# Patient Record
Sex: Female | Born: 1985 | Race: White | Hispanic: No | Marital: Single | Smoking: Former smoker
Health system: Southern US, Community
[De-identification: ages and names within clinical notes are randomized; demographics above are authoritative.]

## PROBLEM LIST (undated history)

## (undated) DIAGNOSIS — I499 Cardiac arrhythmia, unspecified: Secondary | ICD-10-CM

## (undated) DIAGNOSIS — D3A020 Benign carcinoid tumor of the appendix: Secondary | ICD-10-CM

## (undated) DIAGNOSIS — R232 Flushing: Secondary | ICD-10-CM

## (undated) DIAGNOSIS — C801 Malignant (primary) neoplasm, unspecified: Secondary | ICD-10-CM

## (undated) DIAGNOSIS — O009 Unspecified ectopic pregnancy without intrauterine pregnancy: Secondary | ICD-10-CM

## (undated) DIAGNOSIS — R519 Headache, unspecified: Secondary | ICD-10-CM

## (undated) DIAGNOSIS — F419 Anxiety disorder, unspecified: Secondary | ICD-10-CM

## (undated) DIAGNOSIS — Z87442 Personal history of urinary calculi: Secondary | ICD-10-CM

## (undated) DIAGNOSIS — R51 Headache: Secondary | ICD-10-CM

## (undated) HISTORY — DX: Anxiety disorder, unspecified: F41.9

## (undated) HISTORY — PX: MELANOMA EXCISION: SHX5266

## (undated) HISTORY — DX: Flushing: R23.2

## (undated) HISTORY — PX: LAPAROSCOPY: SHX197

## (undated) HISTORY — DX: Cardiac arrhythmia, unspecified: I49.9

## (undated) HISTORY — DX: Benign carcinoid tumor of the appendix: D3A.020

## (undated) HISTORY — DX: Unspecified ectopic pregnancy without intrauterine pregnancy: O00.90

## (undated) HISTORY — PX: APPENDECTOMY: SHX54

---
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
## 2004-08-08 ENCOUNTER — Other Ambulatory Visit: Payer: Self-pay

## 2004-10-05 ENCOUNTER — Emergency Department: Payer: Self-pay | Admitting: Emergency Medicine

## 2004-12-27 ENCOUNTER — Emergency Department: Payer: Self-pay | Admitting: Emergency Medicine

## 2005-01-09 ENCOUNTER — Emergency Department: Payer: Self-pay | Admitting: Emergency Medicine

## 2005-03-25 ENCOUNTER — Emergency Department: Payer: Self-pay | Admitting: Emergency Medicine

## 2005-08-17 ENCOUNTER — Emergency Department: Payer: Self-pay | Admitting: Emergency Medicine

## 2005-09-22 ENCOUNTER — Emergency Department: Payer: Self-pay | Admitting: Emergency Medicine

## 2005-10-04 ENCOUNTER — Emergency Department: Payer: Self-pay | Admitting: General Practice

## 2006-07-28 ENCOUNTER — Emergency Department (HOSPITAL_COMMUNITY): Admission: EM | Admit: 2006-07-28 | Discharge: 2006-07-29 | Payer: Self-pay | Admitting: Emergency Medicine

## 2007-04-07 ENCOUNTER — Emergency Department: Payer: Self-pay | Admitting: Emergency Medicine

## 2007-08-05 ENCOUNTER — Emergency Department: Payer: Self-pay | Admitting: Emergency Medicine

## 2008-03-21 ENCOUNTER — Emergency Department: Payer: Self-pay | Admitting: Emergency Medicine

## 2008-05-10 ENCOUNTER — Emergency Department: Payer: Self-pay | Admitting: Emergency Medicine

## 2008-07-18 ENCOUNTER — Emergency Department: Payer: Self-pay | Admitting: Unknown Physician Specialty

## 2008-10-15 ENCOUNTER — Emergency Department: Payer: Self-pay | Admitting: Emergency Medicine

## 2009-05-19 ENCOUNTER — Emergency Department: Payer: Self-pay | Admitting: Emergency Medicine

## 2009-07-15 ENCOUNTER — Emergency Department: Payer: Self-pay | Admitting: Emergency Medicine

## 2009-10-28 ENCOUNTER — Emergency Department: Payer: Self-pay | Admitting: Emergency Medicine

## 2009-11-10 ENCOUNTER — Emergency Department: Payer: Self-pay | Admitting: Emergency Medicine

## 2010-06-03 ENCOUNTER — Emergency Department: Payer: Self-pay | Admitting: Internal Medicine

## 2010-06-28 ENCOUNTER — Emergency Department: Payer: Self-pay | Admitting: Emergency Medicine

## 2010-07-09 ENCOUNTER — Emergency Department: Payer: Self-pay | Admitting: Internal Medicine

## 2010-10-29 ENCOUNTER — Emergency Department: Payer: Self-pay | Admitting: Emergency Medicine

## 2010-11-06 DIAGNOSIS — D229 Melanocytic nevi, unspecified: Secondary | ICD-10-CM

## 2010-11-06 HISTORY — DX: Melanocytic nevi, unspecified: D22.9

## 2011-05-02 ENCOUNTER — Emergency Department: Payer: Self-pay | Admitting: Emergency Medicine

## 2012-08-11 ENCOUNTER — Ambulatory Visit: Payer: Self-pay | Admitting: Obstetrics and Gynecology

## 2012-08-11 DIAGNOSIS — O009 Unspecified ectopic pregnancy without intrauterine pregnancy: Secondary | ICD-10-CM

## 2012-08-11 HISTORY — PX: COMBINED HYSTEROSCOPY DIAGNOSTIC / D&C: SUR297

## 2012-08-11 HISTORY — DX: Unspecified ectopic pregnancy without intrauterine pregnancy: O00.90

## 2012-08-11 LAB — COMPREHENSIVE METABOLIC PANEL
Alkaline Phosphatase: 45 U/L — ABNORMAL LOW (ref 50–136)
Anion Gap: 6 — ABNORMAL LOW (ref 7–16)
Bilirubin,Total: 0.4 mg/dL (ref 0.2–1.0)
Chloride: 109 mmol/L — ABNORMAL HIGH (ref 98–107)
Co2: 24 mmol/L (ref 21–32)
Creatinine: 0.72 mg/dL (ref 0.60–1.30)
EGFR (African American): >60
EGFR (Non-African Amer.): >60
Osmolality: 276 (ref 275–301)
Potassium: 4.2 mmol/L (ref 3.5–5.1)
Sodium: 139 mmol/L (ref 136–145)

## 2012-08-11 LAB — CBC
HCT: 39 % (ref 35.0–47.0)
HGB: 13.2 g/dL (ref 12.0–16.0)
MCH: 31.1 pg (ref 26.0–34.0)
MCV: 92 fL (ref 80–100)
RBC: 4.24 10*6/uL (ref 3.80–5.20)
RDW: 12.8 % (ref 11.5–14.5)

## 2012-08-11 LAB — URINALYSIS, COMPLETE
Bacteria: NONE SEEN
Glucose,UR: NEGATIVE mg/dL (ref 0–75)
Ketone: NEGATIVE
Leukocyte Esterase: NEGATIVE
Nitrite: NEGATIVE
Ph: 7 (ref 4.5–8.0)
Protein: NEGATIVE
RBC,UR: 1 /HPF (ref 0–5)

## 2012-08-11 LAB — HCG, QUANTITATIVE, PREGNANCY: Beta Hcg, Quant.: 1207 m[IU]/mL — ABNORMAL HIGH

## 2012-08-17 LAB — PATHOLOGY REPORT

## 2012-08-21 HISTORY — PX: SALPINGECTOMY: SHX328

## 2012-09-06 ENCOUNTER — Observation Stay: Payer: Self-pay | Admitting: Obstetrics and Gynecology

## 2012-09-06 LAB — COMPREHENSIVE METABOLIC PANEL
Albumin: 4.4 g/dL (ref 3.4–5.0)
Anion Gap: 8 (ref 7–16)
Calcium, Total: 9 mg/dL (ref 8.5–10.1)
Chloride: 106 mmol/L (ref 98–107)
Co2: 26 mmol/L (ref 21–32)
Creatinine: 0.74 mg/dL (ref 0.60–1.30)
EGFR (African American): >60
EGFR (Non-African Amer.): >60
Osmolality: 277 (ref 275–301)
Potassium: 4.1 mmol/L (ref 3.5–5.1)
SGOT(AST): 22 U/L (ref 15–37)
SGPT (ALT): 15 U/L (ref 12–78)
Sodium: 140 mmol/L (ref 136–145)

## 2012-09-06 LAB — CBC
HCT: 35.3 % (ref 35.0–47.0)
HGB: 12 g/dL (ref 12.0–16.0)
MCV: 92 fL (ref 80–100)
Platelet: 223 10*3/uL (ref 150–440)
WBC: 8.8 10*3/uL (ref 3.6–11.0)

## 2012-09-06 LAB — LIPASE, BLOOD: Lipase: 83 U/L (ref 73–393)

## 2012-09-07 LAB — HEMATOCRIT: HCT: 32.2 % — ABNORMAL LOW (ref 35.0–47.0)

## 2012-09-12 LAB — PATHOLOGY REPORT

## 2012-09-23 ENCOUNTER — Ambulatory Visit: Payer: Self-pay | Admitting: Family Medicine

## 2012-10-11 ENCOUNTER — Ambulatory Visit: Payer: Self-pay | Admitting: Oncology

## 2012-10-21 ENCOUNTER — Ambulatory Visit: Payer: Self-pay | Admitting: Oncology

## 2013-02-20 IMAGING — CR DG CHEST 2V
1 series · 2 of 2 positions shown · non-contrast
Comparison: none

REASON FOR EXAM: cough
COMMENTS:

PROCEDURE:     KDR - KDXR CHEST PA (OR AP) AND LAT  - September 23, 2012  [DATE]
RESULT:     The lungs are well-expanded and clear. The mediastinum is normal
in width. The cardiac silhouette is normal in size. The pulmonary
vascularity is not engorged. There is no pleural effusion.

[Series 1: pa · 0.17mm/px · 2 of 2 slices shown]
[im 1/2]
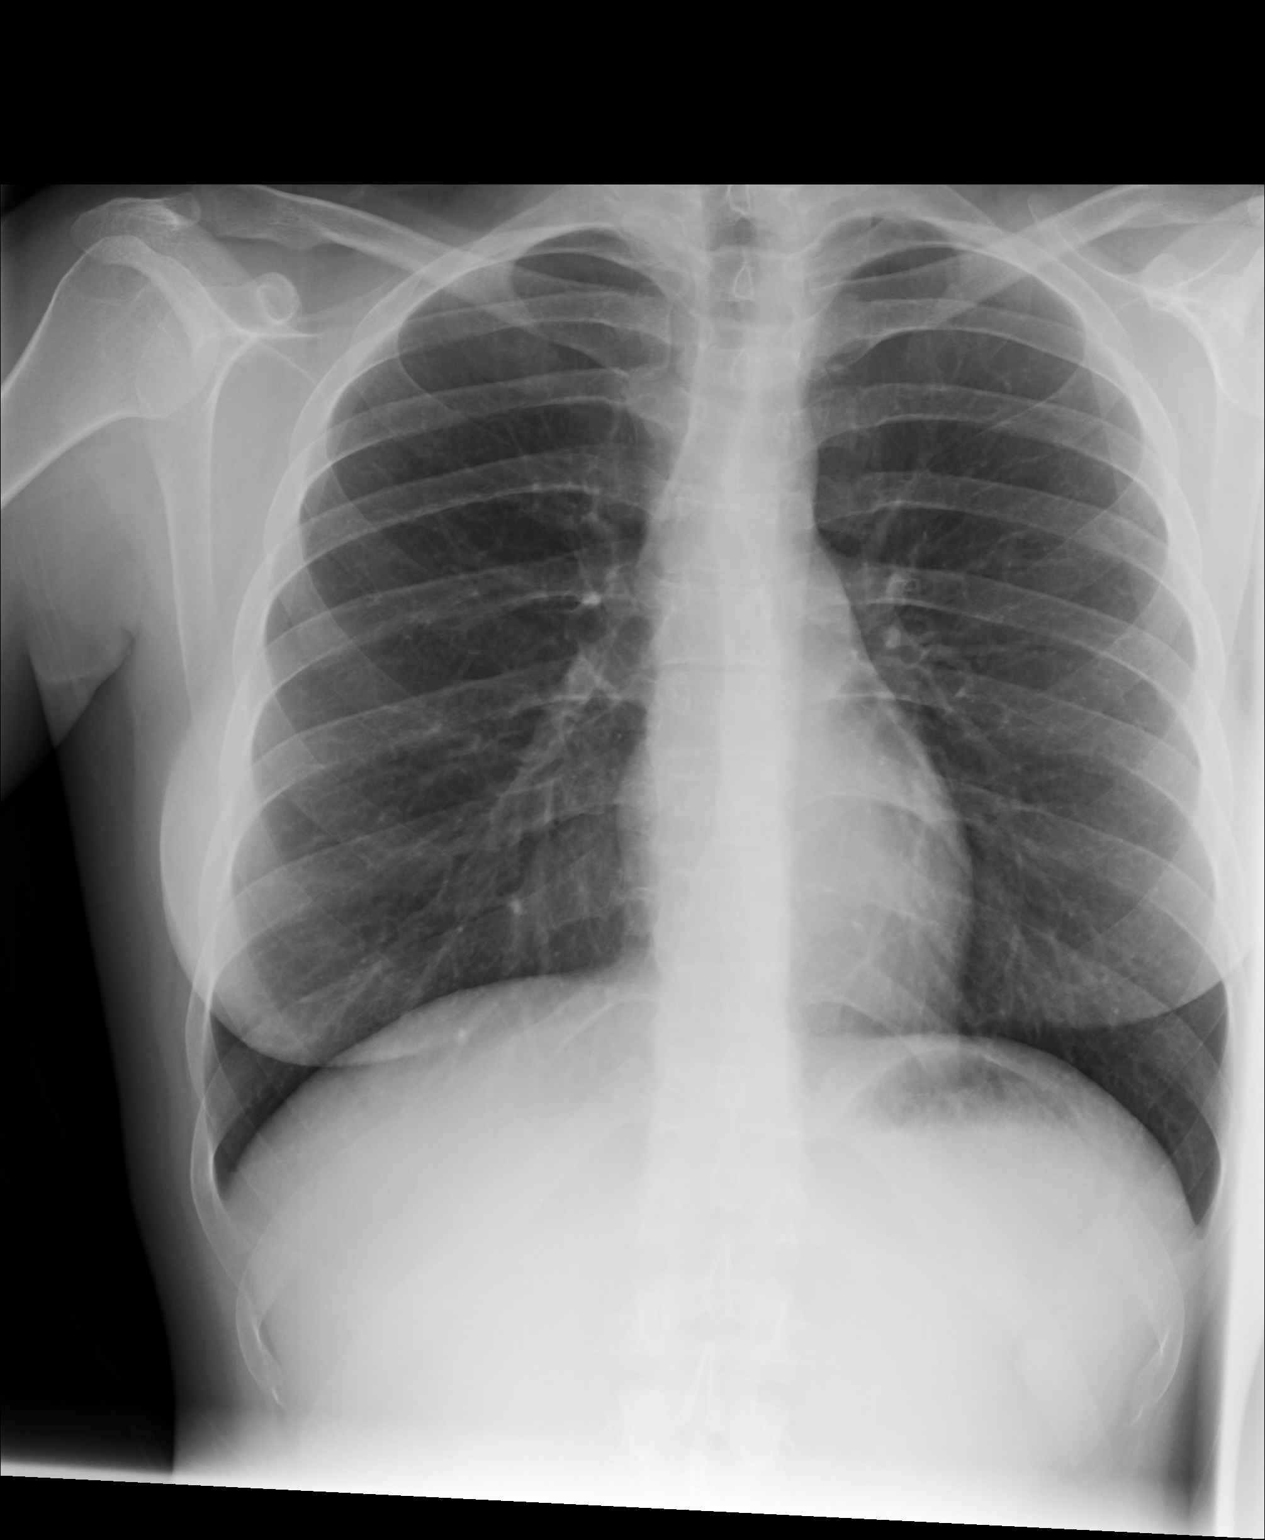
[im 2/2]
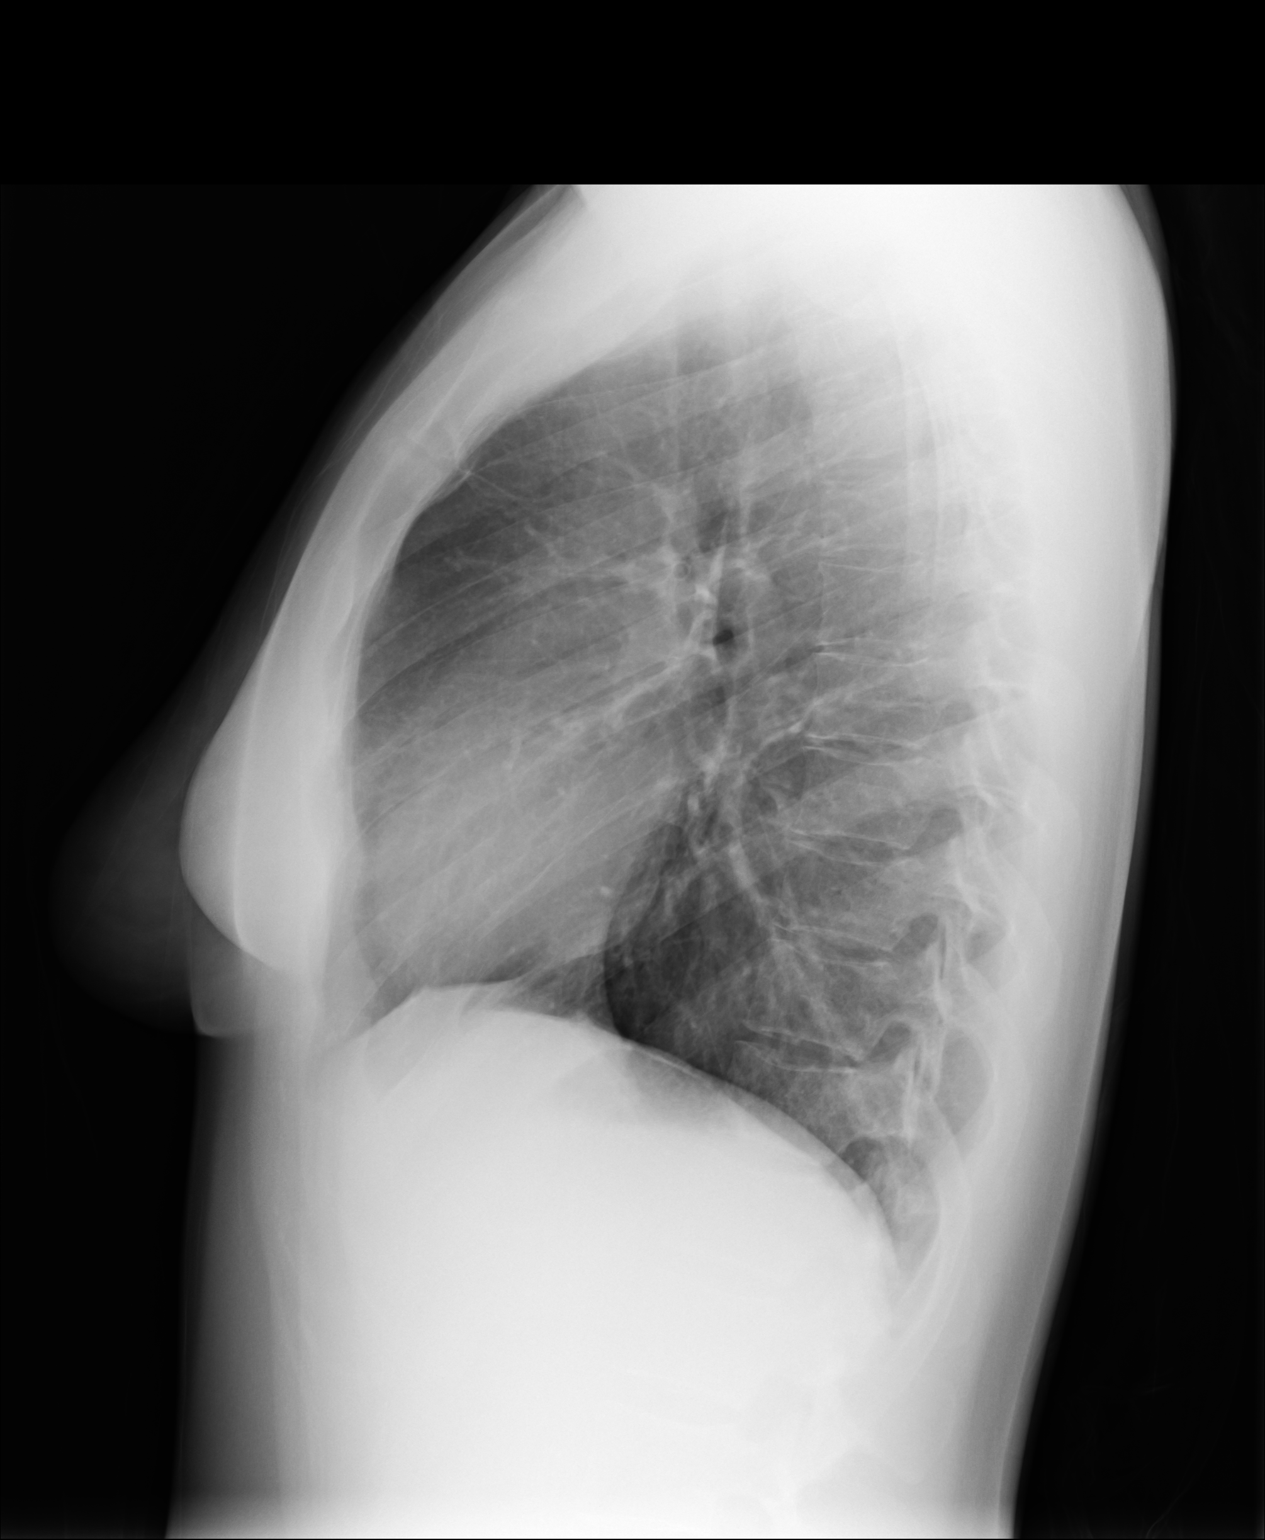

[2 of 2 positions shown; findings below may reference images not displayed]

IMPRESSION: There is no evidence of acute cardiopulmonary abnormality.

[REDACTED]

## 2013-03-21 ENCOUNTER — Ambulatory Visit: Payer: Self-pay | Admitting: Oncology

## 2013-04-03 ENCOUNTER — Ambulatory Visit: Payer: Self-pay | Admitting: Oncology

## 2013-04-03 ENCOUNTER — Emergency Department: Payer: Self-pay | Admitting: Emergency Medicine

## 2013-04-03 LAB — HCG, QUANTITATIVE, PREGNANCY: Beta Hcg, Quant.: <1 m[IU]/mL — ABNORMAL LOW

## 2013-04-12 LAB — HM PAP SMEAR: HM PAP: NEGATIVE

## 2013-04-15 ENCOUNTER — Emergency Department: Payer: Self-pay | Admitting: Emergency Medicine

## 2013-04-15 LAB — COMPREHENSIVE METABOLIC PANEL
Alkaline Phosphatase: 48 U/L — ABNORMAL LOW (ref 50–136)
Anion Gap: 9 (ref 7–16)
BUN: 13 mg/dL (ref 7–18)
Calcium, Total: 9.2 mg/dL (ref 8.5–10.1)
Chloride: 107 mmol/L (ref 98–107)
Co2: 22 mmol/L (ref 21–32)
Osmolality: 274 (ref 275–301)
Potassium: 3.8 mmol/L (ref 3.5–5.1)
SGPT (ALT): 16 U/L (ref 12–78)
Total Protein: 8.3 g/dL — ABNORMAL HIGH (ref 6.4–8.2)

## 2013-04-15 LAB — URINALYSIS, COMPLETE
Bilirubin,UR: NEGATIVE
Blood: NEGATIVE
Leukocyte Esterase: NEGATIVE
Nitrite: NEGATIVE
Ph: 6 (ref 4.5–8.0)
RBC,UR: <1 /HPF (ref 0–5)
Specific Gravity: 1.019 (ref 1.003–1.030)
WBC UR: 5 /HPF (ref 0–5)

## 2013-04-15 LAB — HCG, QUANTITATIVE, PREGNANCY: Beta Hcg, Quant.: 602 m[IU]/mL — ABNORMAL HIGH

## 2013-04-15 LAB — CBC
HGB: 14 g/dL (ref 12.0–16.0)
MCH: 30.3 pg (ref 26.0–34.0)
MCV: 91 fL (ref 80–100)
RBC: 4.62 10*6/uL (ref 3.80–5.20)

## 2013-04-15 LAB — WET PREP, GENITAL

## 2013-06-26 ENCOUNTER — Emergency Department: Payer: Self-pay | Admitting: Emergency Medicine

## 2013-06-26 LAB — CBC
HGB: 12 g/dL (ref 12.0–16.0)
MCH: 30.6 pg (ref 26.0–34.0)
MCHC: 34.1 g/dL (ref 32.0–36.0)
MCV: 90 fL (ref 80–100)
RBC: 3.94 10*6/uL (ref 3.80–5.20)

## 2013-06-26 LAB — BASIC METABOLIC PANEL
BUN: 9 mg/dL (ref 7–18)
Chloride: 104 mmol/L (ref 98–107)
Co2: 22 mmol/L (ref 21–32)
Creatinine: 0.58 mg/dL — ABNORMAL LOW (ref 0.60–1.30)
EGFR (Non-African Amer.): >60
Glucose: 72 mg/dL (ref 65–99)
Potassium: 3.8 mmol/L (ref 3.5–5.1)
Sodium: 136 mmol/L (ref 136–145)

## 2013-06-26 LAB — URINALYSIS, COMPLETE
Bilirubin,UR: NEGATIVE
Blood: NEGATIVE
Leukocyte Esterase: NEGATIVE
Protein: NEGATIVE
RBC,UR: 1 /HPF (ref 0–5)
Squamous Epithelial: 2

## 2013-06-26 LAB — HCG, QUANTITATIVE, PREGNANCY: Beta Hcg, Quant.: 42877 m[IU]/mL — ABNORMAL HIGH

## 2013-10-22 ENCOUNTER — Observation Stay: Payer: Self-pay | Admitting: Obstetrics and Gynecology

## 2013-10-22 LAB — URINALYSIS, COMPLETE
Bacteria: NONE SEEN
Blood: NEGATIVE
Leukocyte Esterase: NEGATIVE
Nitrite: NEGATIVE
Protein: NEGATIVE
Specific Gravity: 1.004 (ref 1.003–1.030)
Squamous Epithelial: 2
WBC UR: 1 /HPF (ref 0–5)

## 2013-10-22 LAB — FETAL FIBRONECTIN: Appearance: NORMAL

## 2013-12-13 ENCOUNTER — Observation Stay: Payer: Self-pay

## 2013-12-25 ENCOUNTER — Inpatient Hospital Stay: Payer: Self-pay | Admitting: Obstetrics and Gynecology

## 2013-12-26 LAB — CBC WITH DIFFERENTIAL/PLATELET
BASOS ABS: 0 10*3/uL (ref 0.0–0.1)
Basophil %: 0.4 %
EOS ABS: 0.1 10*3/uL (ref 0.0–0.7)
Eosinophil %: 1 %
HCT: 35 % (ref 35.0–47.0)
HGB: 11.8 g/dL — ABNORMAL LOW (ref 12.0–16.0)
Lymphocyte #: 1.3 10*3/uL (ref 1.0–3.6)
Lymphocyte %: 12.1 %
MCH: 28.9 pg (ref 26.0–34.0)
MCHC: 33.6 g/dL (ref 32.0–36.0)
MCV: 86 fL (ref 80–100)
Monocyte #: 0.8 x10 3/mm (ref 0.2–0.9)
Monocyte %: 7.1 %
NEUTROS ABS: 8.8 10*3/uL — AB (ref 1.4–6.5)
NEUTROS PCT: 79.4 %
PLATELETS: 163 10*3/uL (ref 150–440)
RBC: 4.08 10*6/uL (ref 3.80–5.20)
RDW: 12.7 % (ref 11.5–14.5)
WBC: 11.1 10*3/uL — ABNORMAL HIGH (ref 3.6–11.0)

## 2013-12-28 LAB — PATHOLOGY REPORT

## 2013-12-28 LAB — HEMATOCRIT: HCT: 29 % — AB (ref 35.0–47.0)

## 2014-05-15 ENCOUNTER — Emergency Department: Payer: Self-pay | Admitting: Emergency Medicine

## 2014-05-15 LAB — URINALYSIS, COMPLETE
BACTERIA: NONE SEEN
Bilirubin,UR: NEGATIVE
GLUCOSE, UR: NEGATIVE mg/dL (ref 0–75)
KETONE: NEGATIVE
LEUKOCYTE ESTERASE: NEGATIVE
Nitrite: NEGATIVE
Ph: 6 (ref 4.5–8.0)
Protein: NEGATIVE
RBC,UR: 3 /HPF (ref 0–5)
Specific Gravity: 1.023 (ref 1.003–1.030)

## 2014-05-27 ENCOUNTER — Emergency Department: Payer: Self-pay | Admitting: Emergency Medicine

## 2014-05-30 LAB — BETA STREP CULTURE(ARMC)

## 2014-06-13 DIAGNOSIS — F17200 Nicotine dependence, unspecified, uncomplicated: Secondary | ICD-10-CM | POA: Insufficient documentation

## 2014-09-19 DIAGNOSIS — E079 Disorder of thyroid, unspecified: Secondary | ICD-10-CM | POA: Insufficient documentation

## 2014-09-19 DIAGNOSIS — F419 Anxiety disorder, unspecified: Secondary | ICD-10-CM | POA: Insufficient documentation

## 2014-09-19 DIAGNOSIS — Z91041 Radiographic dye allergy status: Secondary | ICD-10-CM | POA: Insufficient documentation

## 2015-01-08 DIAGNOSIS — C439 Malignant melanoma of skin, unspecified: Secondary | ICD-10-CM

## 2015-01-08 HISTORY — DX: Malignant melanoma of skin, unspecified: C43.9

## 2015-04-09 NOTE — H&P (Signed)
    Subjective/Chief Complaint pt presents to the er with severe pain last night and now with ionability to get comfortable and some spotting    History of Present Illness one week ago a pt had one day of diarrhea and inability to slep, she had some severe ain last night and almost came to er, she came in this am becasue she was spotting and bleeding dark blood at work and she was uncomfrtsble. she took nothing. no position made it better, she has irregular periods and she says the pasin is dextedning  in toher back, she feel slike she needs to have a bm or padss gas abnd cannot. her last bm was 2-3 ays ago.. she has had some nausea, she has some peritoneal sugns by bracing herself on the car    Past History no bc for almost two yeras with no pregnancy, stopped ocp because she wanted to get pregnant with  her bf, was only on pills for regulation of her menses    Past Medical Health Smoking   Past Med/Surgical Hx:  Kidney Stones:   melanoma skin ca removed from her back:   ALLERGIES:  PCN: Hives, Swelling  Family and Social History:   Family History Non-Contributory    Social History positive  tobacco (Current within 1 year), positive ETOH    Place of Living Home   Review of Systems:   Subjective/Chief Complaint severe abdominal pain, discomfort    Fever/Chills No    Cough No    Sputum No    Abdominal Pain Yes    Diarrhea Yes    Constipation Yes    Nausea/Vomiting Yes    SOB/DOE No    Chest Pain No    Dysuria No    Tolerating Diet Nauseated   Physical Exam:   GEN well developed, well nourished, looks uncomfrtsblr    HEENT PERRL, hearing intact to voice, good dentition    NECK supple  No masses  trachea midline    RESP normal resp effort  clear BS    CARD regular rate  no murmur    ABD positive tenderness  distended  hypoactive BS    LYMPH negative neck, negative axillae    EXTR negative cyanosis/clubbing, negative edema    SKIN normal to palpation     NEURO cranial nerves intact    PSYCH alert, A+O to time, place, person    Additional Comments some pain on the rlq but not seveer, no rebound,     Assessment/Admission Diagnosis smptoms of ectopic asnd appendix, most likel ectopic    Plan take to or for dx ls salpingotomy, d adc   Electronic Signatures: Erik Obey (MD)  (Signed [phone] 18:49)  Authored: CHIEF COMPLAINT and HISTORY, PAST MEDICAL/SURGIAL HISTORY, ALLERGIES, FAMILY AND SOCIAL HISTORY, REVIEW OF SYSTEMS, PHYSICAL EXAM, ASSESSMENT AND PLAN   Last Updated: 22-Aug-13 18:49 by Erik Obey (MD)

## 2015-04-09 NOTE — Op Note (Signed)
PATIENT NAME:  Tracey Watson, Tracey Watson MR#:  [id number] DATE OF BIRTH:  1986/07/01  DATE OF PROCEDURE:  09/06/2012  PREOPERATIVE DIAGNOSIS: Ectopic pregnancy.   POSTOPERATIVE DIAGNOSIS: Ectopic pregnancy.  PROCEDURES: Diagnostic laparoscopy, evacuation of hemoperitoneum, right salpingectomy, and incidental appendectomy.   SURGEON: Phoebe Perch, MD  CO-SURGEON: Donneta Romberg, MD  ANESTHESIA: General with endotracheal tube.   INDICATIONS: This is a patient who Dr. Glennon Mac has taken to the Operating Room for recurrent ectopic pregnancy with a right tubal mass with abdominal pain. Because of the patient's complicated history with the need for a second operation, we discussed the rationale for offering general surgical intraoperative consultation with the possibility of incidental appendectomy. The risks of bleeding, infection, recurrent problems including bleeding and bowel injury, and open procedure were discussed with she and her significant other. Preoperatively, they understood and agreed to proceed.   Of note, Dr. Glennon Mac will dictate his portion of the operative report, and I will dictate the incidental appendectomy.   FINDINGS: Hemoperitoneum and right salpingeal mass suggestive of ectopic pregnancy, hemoperitoneum of approximately 200 to 300 mL, and normal appearing appendix.   DESCRIPTION OF PROCEDURE: The patient was induced to general anesthesia and given IV antibiotics. She was placed in a well-padded lithotomy position by Dr. Glennon Mac and the nursing staff and a Foley catheter was placed after exam under anesthesia by Dr. Glennon Mac as well. All of this is dictated separately.   Once the patient was prepped and draped in a sterile fashion, Marcaine was infiltrated in the skin and subcutaneous tissues around the infraumbilical area. Dr. Glennon Mac placed a 5 mm port and explored the abdomen finding hemoperitoneum and then under direct vision utilizing the patient's previous port sites another  suprapubic 5 mm port and a left lateral 12 mm port was placed avoiding the patient's tattoo. Hemoperitoneum was evacuated. Dr. Glennon Mac used the Harmonic scalpel to remove the right tube. Hemostasis was adequate.   My portion of the operation was the incidental appendectomy where the appendix was elevated. The base of the appendix was dissected free and the base of the appendix was divided with a standard load Endo GIA and then the Harmonic scalpel was utilized to divide the mesoappendix. No bleeding was noted. The area was irrigated with copious amounts of normal saline. Hemoperitoneum was evacuated. The patient was taken out of Trendelenburg position and blood that had been up around the liver was evacuated as well.   Under direct vision, the left lateral 12 mm port site was closed with two separate simple sutures of 0 Vicryl utilizing an Endo Close technique under direct vision after passing out both specimens through the left lateral port site with the aid of an Endo Catch bag.   The patient tolerated the procedure well. There were no complications. She was taken to the recovery room in stable condition to be admitted for continued care after closing her wounds with subcuticular 4-0 Monocryl and Dermabond. Again, a portion of this operation will be dictated by Dr. Glennon Mac. Sponge, lap, and needle counts were correct. ____________________________ Jerrol Banana. Burt Knack, MD rec:slb D: 09/06/2012 22:41:52 ET T: 09/07/2012 09:45:02 ET JOB#: [id number]  cc: Jerrol Banana. Burt Knack, MD, <Dictator> Florene Glen MD ELECTRONICALLY SIGNED 09/07/2012 19:10

## 2015-04-09 NOTE — Op Note (Signed)
PATIENT NAME:  Tracey Watson MR#:  [id number] DATE OF BIRTH:  [birth year]  DATE OF PROCEDURE:  09/06/2012  PREOPERATIVE DIAGNOSIS: Right lower quadrant pain, suspect ectopic pregnancy.   POSTOPERATIVE DIAGNOSIS: Ectopic pregnancy in right fallopian tube.   PROCEDURES:  1. Diagnostic laparoscopy.  2. Right salpingectomy.  3. Incidental appendectomy (performed by Dr. Phoebe Perch).   SURGEON: Prentice Docker, M.D.   ASSISTANT: Phoebe Perch, MD (primarily performed laparoscopic appendectomy).   ANESTHESIA: General.   ESTIMATED BLOOD LOSS: 200 mL (200 mL hemoperitoneum upon entry into the abdomen with minimal blood loss during surgery).   OPERATIVE FLUIDS: 500 mL crystalloid.  COMPLICATIONS: None.   FINDINGS:  1. Hemoperitoneum upon entry into the abdomen.  2. Right fallopian tube with area of erythema and swelling, highly suspicious for ectopic pregnancy.  3. Normal appearing left fallopian tube.  4. Normal appearing appendix.   SPECIMENS:  1. Right fallopian tube with ectopic pregnancy.  2. Appendix.   CONDITION AT THE END OF PROCEDURE: Stable.   INDICATIONS FOR PROCEDURE:   Tracey Watson is a 29 year old female who underwent surgery for ectopic pregnancy in the right fallopian tube in late August of this year. She had resolution of her symptoms except for the fact that she had intermittent vaginal bleeding in the early part of her postoperative course and would have intermittent vaginal bleeding associated with intercourse more recently. Approximately three days ago, she began having diffuse abdominal pain which worsened over the last couple of days. She presented to clinic today with severe abdominal pain. A pelvic ultrasound was performed that was highly suspicious for ectopic pregnancy with free fluid in the cul-de-sac. She was sent to the hospital for evaluation and likely surgery. She had an evaluation prior to her surgery given concern for diffuse abdominal pain with  especially right upper quadrant pain. With that concern, general surgery was consulted and was involved in the surgery. Prior to the surgery, she was consented for both gynecologic and general surgery procedures including right salpingectomy as well as appendectomy and any other indicated procedures given the nature of her pain. For the above, she was taken to the Operating Room.   DESCRIPTION OF PROCEDURE: The patient was taken to the Operating Room where general anesthesia was administered and found to be adequate. She was placed in the dorsal supine lithotomy position and prepped and draped in the usual sterile fashion. After a time-out was called, an indwelling Foley catheter was placed in her bladder. A sponge stick was placed in the vagina for later manipulation. Attention was turned to the abdomen where using her prior scars from her surgery one month ago, a 5 mm infraumbilical incision was made after injection of 0.25% Marcaine with epinephrine. Entry into the abdomen was obtained using the Optiview trocar using direct visualization. Upon entering the abdomen, abdominal entry was verified using opening pressure and once the abdomen was insufflated with CO2, the camera was reintroduced and atraumatic entry was verified. The abdomen was inspected with the above-noted findings. Hemopelvis and hemoperitoneum were noted with approximately 200 mL of blood already in the pelvis. A 5 mm right lower quadrant port and a 12 mm left lower quadrant port were placed under direct intra-abdominal camera visualization without difficulty. The pelvis was evacuated of blood and the abdomen was inspected with the above-noted findings.   Attention was turned to the right fallopian tube where the fimbriated end was grasped and elevated and using the Harmonic scalpel, the mesosalpinx was transected from a lateral  to medial direction and the right fallopian tube was removed. An EndoCatch bag was introduced through the left lower  quadrant port. The fallopian tube was placed in an EndoCatch bag and the EndoCatch bag, along with the specimen, were removed from the abdominal cavity at that point.   Attention was then turned to the cecum where at this point Dr. Burt Knack inspected the appendix and decided that removal was necessary and performed an appendectomy. For complete details of appendectomy portion of the surgery, please see Dr. Antionette Char operative report. After hemostasis was verified in all operative sites and in the abdomen, her abdomen was copiously irrigated and all irrigant was removed. The left lower quadrant port site was closed using fascial closure of 2-0 Vicryl fascial closure stitches and the abdomen was desufflated and the rest of the trocars were removed from the abdomen. A subcuticular stitch was used to reapproximate the left lower quadrant incision. The right lower quadrant and infraumbilical incisions had a single 4-0 Monocryl stitch placed for support and Dermabond was used to close all skin incision sites. Extra local anesthetic was injected at all incision sites at the end of the procedure. The Foley catheter was removed at the end of the procedure as well as the sponge stick and verification of the vagina being empty of all instrumentation was undertaken at the end of the procedure with no instruments or lap sponges remaining in the vagina at the end of the procedure.  The patient tolerated the procedure well. Lap, sponge and needle counts were correct x2. The patient received Mefoxin 2 grams prior to incision. She was awakened  the Operating Room and taken to the recovery area in stable condition.   ____________________________ Will Bonnet, MD sdj:ap D: 09/06/2012 23:20:49 ET T: 09/07/2012 10:11:24 ET JOB#: [id number]  cc: Will Bonnet, MD, <Dictator>  Will Bonnet MD ELECTRONICALLY SIGNED 10/18/2012 12:47

## 2016-10-15 DIAGNOSIS — C4359 Malignant melanoma of other part of trunk: Secondary | ICD-10-CM | POA: Insufficient documentation

## 2016-10-15 DIAGNOSIS — C181 Malignant neoplasm of appendix: Secondary | ICD-10-CM

## 2016-10-15 DIAGNOSIS — Z975 Presence of (intrauterine) contraceptive device: Secondary | ICD-10-CM | POA: Insufficient documentation

## 2016-10-15 HISTORY — DX: Malignant neoplasm of appendix: C18.1

## 2018-02-22 ENCOUNTER — Encounter: Payer: Self-pay | Admitting: Obstetrics and Gynecology

## 2018-02-23 ENCOUNTER — Ambulatory Visit (INDEPENDENT_AMBULATORY_CARE_PROVIDER_SITE_OTHER): Payer: 59 | Admitting: Obstetrics and Gynecology

## 2018-02-23 ENCOUNTER — Telehealth: Payer: Self-pay | Admitting: Obstetrics and Gynecology

## 2018-02-23 ENCOUNTER — Encounter: Payer: Self-pay | Admitting: Obstetrics and Gynecology

## 2018-02-23 DIAGNOSIS — Z308 Encounter for other contraceptive management: Secondary | ICD-10-CM | POA: Diagnosis not present

## 2018-02-23 DIAGNOSIS — Z01419 Encounter for gynecological examination (general) (routine) without abnormal findings: Secondary | ICD-10-CM

## 2018-02-23 MED ORDER — METRONIDAZOLE 500 MG PO TABS: 500.0000 mg | ORAL_TABLET | Freq: Two times a day (BID) | ORAL | 0 refills | Status: AC

## 2018-02-23 NOTE — Progress Notes (Signed)
Gynecology Annual Exam   PCP: Patient, No Pcp Per  Chief Complaint:  Chief Complaint  Patient presents with  . Gynecologic Exam    Bleeding on IUD (MIrena)  . Vaginal Discharge    Discharge w/odor  . IUD removal    History of Present Illness: Patient is a 32 y.o. G2P1011 presents for annual exam. The patient has no complaints today.   LMP: No LMP recorded. Patient is not currently having periods (Reason: IUD).  Experiencing very irregular but consistent break-through bleeding.  Also continues to have pain, this was initially attributed to her IUD being potentially in the lower uterine segment but imaging at [facility] 2017 revealed good positioning and deployment of the IUD.  She reports she does not desire future fertility and is actually most interested in tubal ligation  The patient is sexually active. She currently uses IUD for contraception. She does report dyspareunia.  The patient does perform self breast exams.  There is no notable family history of breast or ovarian cancer in her family.  The patient wears seatbelts: yes.   The patient has regular exercise: not asked.    The patient denies current symptoms of depression.    Tracey Watson is a 32 y.o. G2P1011 who LMP was No LMP recorded. Patient is not currently having periods (Reason: IUD)., presents today for a problem visit.   Patient complains of an abnormal vaginal discharge for off and on for about a week. Vaginal symptoms include discharge described as grey and odor.   Other associated symptoms: none.Menstrual pattern: She had been bleeding irregularly. Contraception: IUD.  She denies recent antibiotic exposure, denies changes in soaps, detergents coinciding with the onset of her symptoms.  She has previously self treated or been under treatment by another provider for these symptoms.  Tried some left over diflucan that she had with no improvement in symptoms  Review of Systems: Review of Systems  Constitutional:  Positive for malaise/fatigue. Negative for chills, diaphoresis, fever and weight loss.  HENT: Positive for sore throat. Negative for ear discharge, ear pain, hearing loss, nosebleeds and tinnitus.   Eyes: Negative.   Respiratory: Negative.   Cardiovascular: Negative.   Gastrointestinal: Negative.   Genitourinary: Negative.   Musculoskeletal: Negative.   Skin: Negative.   Neurological: Positive for tingling. Negative for dizziness, tremors, sensory change, speech change, weakness and headaches.  Endo/Heme/Allergies: Negative.   Psychiatric/Behavioral: Negative for depression. The patient is not nervous/anxious.     Past Medical History:  Past Medical History:  Diagnosis Date  . Benign carcinoid tumor of appendix   . Ectopic pregnancy 08/11/2012  . Hot flashes   . Irregular heart beat     Past Surgical History:  Past Surgical History:  Procedure Laterality Date  . APPENDECTOMY    . COLPOSCOPY  2003  . COMBINED HYSTEROSCOPY DIAGNOSTIC / D&C  08/11/2012  . LAPAROSCOPY  08/11/2012, 08/2012   Right salpingostomy Ectopic pregnancy by [place], 08/2012 Right Salpingectomy remaining ectopic  . MELANOMA EXCISION    . SALPINGECTOMY  08/2012    Gynecologic History:  No LMP recorded. Patient is not currently having periods (Reason: IUD). Contraception: IUD Last Pap: Results were:09/08/2016 no abnormalities  Position of IUD confirmed [place] [facility] 10/23/2016  Obstetric History: G2P1011  Family History:  History reviewed. No pertinent family history.  Social History:  Social History   Socioeconomic History  . Marital status: Single    Spouse name: Not on file  . Number of children: Not  on file  . Years of education: Not on file  . Highest education level: Not on file  Social Needs  . Financial resource strain: Not on file  . Food insecurity - worry: Not on file  . Food insecurity - inability: Not on file  . Transportation needs - medical: Not on file  . Transportation needs -  non-medical: Not on file  Occupational History  . Not on file  Tobacco Use  . Smoking status: Current Every Day Smoker    Types: Cigarettes  . Smokeless tobacco: Never Used  Substance and Sexual Activity  . Alcohol use: Yes    Comment: Socially  . Drug use: No  . Sexual activity: Yes    Partners: Male    Birth control/protection: IUD  Other Topics Concern  . Not on file  Social History Narrative  . Not on file    Allergies:  No Known Allergies  Medications: Prior to Admission medications   Medication Sig Start Date End Date Taking? Authorizing Provider  levonorgestrel (MIRENA) 20 MCG/24HR IUD by Intrauterine route.   Yes [provider]  loratadine (CLARITIN) 10 MG tablet Take 10 mg by mouth.   Yes [provider]    Physical Exam Vitals: Blood pressure 114/66, pulse 87, height 5\' 4"  (1.626 m), weight 152 lb (68.9 kg).  General: NAD HEENT: normocephalic, anicteric Thyroid: no enlargement, no palpable nodules Pulmonary: No increased work of breathing, CTAB Cardiovascular: RRR, distal pulses 2+ Breast: Breast symmetrical, no tenderness, no palpable nodules or masses, no skin or nipple retraction present, no nipple discharge.  No axillary or supraclavicular lymphadenopathy. Abdomen: NABS, soft, non-tender, non-distended.  Umbilicus without lesions.  No hepatomegaly, splenomegaly or masses palpable. No evidence of hernia  Genitourinary:  External: Normal external female genitalia.  Normal urethral meatus, normal  Bartholin's and Skene's glands.    Vagina: Normal vaginal mucosa, no evidence of prolapse.    Cervix: Grossly normal in appearance, no bleeding  Uterus: Non-enlarged, mobile, normal contour.  No CMT  Adnexa: ovaries non-enlarged, no adnexal masses  Rectal: deferred  Lymphatic: no evidence of inguinal lymphadenopathy Extremities: no edema, erythema, or tenderness Neurologic: Grossly intact Psychiatric: mood appropriate, affect full  Female  chaperone present for pelvic and breast  portions of the physical exam    Assessment: 32 y.o. G2P1011 routine annual exam  Plan: Problem List Items Addressed This Visit    None    Visit Diagnoses    Encounter for gynecological examination without abnormal finding       Encounter for tubal ligation counseling          2) STI screening  was notoffered and therefore not obtained  2)  ASCCP guidelines and rational discussed.  Patient opts for every 3 years screening interval  3) Contraception - the patient is currently using  IUD.  She is interested in changing to tubal ligation  4) Routine healthcare maintenance including cholesterol, diabetes screening discussed managed by PCP  5) Post laproscopic salpingectomy and IUD removal.  Other reversible forms of contraception were discussed with patient; she declines all other modalities. Permanent nature of as well as associated risks of the procedure discussed with patient including but not limited to: risk of regret, permanence of method, bleeding, infection, injury to surrounding organs and need for additional procedures.  Failure risk of 0.5-1% with increased risk of ectopic gestation if pregnancy occurs was also discussed with patient.    6) Risk factors for bacterial vaginosis and candida infections discussed.  We discussed normal vaginal flora/microbiome.  Any factors that may alter the microbiome increase the risk of these opportunistic infections.  These include changes in pH, antibiotic exposures, diabetes, wet bathing suits etc.  We discussed that treatment is aimed at eradicating abnormal bacterial overgrowth and or yeast.  There may be some role for vaginal probiotics in restoring normal vaginal flora.    7)  Return in about 1 year (around 02/24/2019) for aNNUAL.   Malachy Mood, MD, [facility], [facility] 02/23/2018, 9:41 AM

## 2018-02-23 NOTE — Telephone Encounter (Signed)
Patient is aware of H&P at [facility] on 03/18/18 @ 9:10am, Pre-admit Testing phone interview to be scheduled, and OR on 03/24/18.

## 2018-02-23 NOTE — Telephone Encounter (Signed)
-----   Message from Malachy Mood, MD sent at 02/23/2018  9:41 AM EST ----- Regarding: Surgery Surgery Date:   LOS: same day surgery  Surgery Booking Request Patient Full Name: Tracey Watson MRN: [id number]  DOB: 1986-08-11  Surgeon: Malachy Mood, MD  Requested Surgery Date and Time: Undesired fertility Primary Diagnosis and Code:  Secondary Diagnosis and Code:  Surgical Procedure: Laparoscopic left salpingectomy, and Mirena IUD removal L&D Notification:no Admission Status: same day surgery Length of Surgery: 1h Special Case Needs: Falope rings H&P:  (date) Phone Interview or Office Pre-Admit: phone interview Interpreter: No Language: English Medical Clearance: No Special Scheduling Instructions: none

## 2018-03-18 ENCOUNTER — Encounter
Admission: RE | Admit: 2018-03-18 | Discharge: 2018-03-18 | Disposition: A | Discharge status: Home / Self Care | Payer: 59 | Source: Ambulatory Visit | Attending: Obstetrics and Gynecology | Admitting: Obstetrics and Gynecology

## 2018-03-18 ENCOUNTER — Other Ambulatory Visit: Payer: Self-pay

## 2018-03-18 ENCOUNTER — Encounter: Payer: Self-pay | Admitting: Obstetrics and Gynecology

## 2018-03-18 ENCOUNTER — Ambulatory Visit (INDEPENDENT_AMBULATORY_CARE_PROVIDER_SITE_OTHER): Payer: 59 | Admitting: Obstetrics and Gynecology

## 2018-03-18 VITALS — BP 100/66 | HR 65 | Ht 64.0 in | Wt 153.0 lb

## 2018-03-18 DIAGNOSIS — Z01818 Encounter for other preprocedural examination: Secondary | ICD-10-CM

## 2018-03-18 HISTORY — DX: Headache, unspecified: R51.9

## 2018-03-18 HISTORY — DX: Personal history of urinary calculi: Z87.442

## 2018-03-18 HISTORY — DX: Malignant (primary) neoplasm, unspecified: C80.1

## 2018-03-18 HISTORY — DX: Headache: R51

## 2018-03-18 NOTE — Progress Notes (Signed)
Obstetrics & Gynecology Surgery H&P    Chief Complaint: Scheduled Surgery   History of Present Illness: Patient is a 32 y.o. G2P1011 presenting for scheduled Mirena IUD removal, hysteroscopy, D&C, laparoscopic bilateral salpingectomy, and pelvic washings, for the treatment or further evaluation of retained Mirena IUD, menorrhagia, and undesired fertility.   Prior Treatments prior to proceeding with surgery include: [place] confirming IUD placement  Preoperative Pap:09/08/2016  Results: no abnormalities  Preoperative Ultrasound: 10/23/2016 IUD confirmed in uterine cavity   Review of Systems:10 point review of systems  Past Medical History:  Past Medical History:  Diagnosis Date  . Benign carcinoid tumor of appendix   . Ectopic pregnancy 08/11/2012  . Hot flashes   . Irregular heart beat     Past Surgical History:  Past Surgical History:  Procedure Laterality Date  . APPENDECTOMY    . COLPOSCOPY  2003  . COMBINED HYSTEROSCOPY DIAGNOSTIC / D&C  08/11/2012  . LAPAROSCOPY  08/11/2012, 08/2012   Right salpingostomy Ectopic pregnancy by Klett, 08/2012 Right Salpingectomy remaining ectopic  . MELANOMA EXCISION    . SALPINGECTOMY  08/2012    Family History:  No family history on file.  Social History:  Social History   Socioeconomic History  . Marital status: Single    Spouse name: Not on file  . Number of children: Not on file  . Years of education: Not on file  . Highest education level: Not on file  Occupational History  . Not on file  Social Needs  . Financial resource strain: Not on file  . Food insecurity:    Worry: Not on file    Inability: Not on file  . Transportation needs:    Medical: Not on file    Non-medical: Not on file  Tobacco Use  . Smoking status: Current Every Day Smoker    Types: Cigarettes  . Smokeless tobacco: Never Used  Substance and Sexual Activity  . Alcohol use: Yes    Comment: Socially  . Drug use: No  . Sexual activity: Yes   Partners: Male    Birth control/protection: IUD  Lifestyle  . Physical activity:    Days per week: 0 days    Minutes per session: 0 min  . Stress: To some extent  Relationships  . Social connections:    Talks on phone: More than three times a week    Gets together: Once a week    Attends religious service: 1 to 4 times per year    Active member of club or organization: No    Attends meetings of clubs or organizations: Never    Relationship status: Living with partner  . Intimate partner violence:    Fear of current or ex partner: No    Emotionally abused: No    Physically abused: No    Forced sexual activity: No  Other Topics Concern  . Not on file  Social History Narrative  . Not on file    Allergies:  No Known Allergies  Medications: Prior to Admission medications   Medication Sig Start Date End Date Taking? Authorizing Provider  acetaminophen (TYLENOL) 500 MG tablet Take 1,000 mg by mouth every 6 (six) hours as needed for moderate pain or headache.   Yes [provider]  levonorgestrel (MIRENA) 20 MCG/24HR IUD 1 each by Intrauterine route once.    Yes [provider]  loratadine (CLARITIN) 10 MG tablet Take 10 mg by mouth daily as needed for allergies.    Yes [provider]    Physical Exam Vitals: Blood pressure 100/66, pulse 65, height 5\' 4"  (1.626 m), weight 153 lb (69.4 kg). General: NAD HEENT: normocephalic, anicteric Pulmonary: CTAB, No increased work of breathing Cardiovascular: RRR, distal pulses 2+ Abdomen: soft, non-tender, non-distended Genitourinary: deferred Extremities: no edema, erythema, or tenderness Neurologic: Grossly intact Psychiatric: mood appropriate, affect full  Imaging No results found.  Assessment: 31 y.o. G2P1011 presenting for scheduled  Mirena IUD removal, hysteroscopy, D&C, laparoscopic bilateral salpingectomy, and pelvic washings  Plan: 1) I have had a careful discussion with this patient about all  the options available and the risk/benefits of each. I have fully informed this patient that a laparoscopy may subject her to a variety of discomforts and risks: She understands that most patients have surgery with little difficulty, but problems can happen ranging from minor to fatal. These include nausea, vomiting, pain, bleeding, infection, poor healing, hernia, or formation of adhesions. Unexpected reactions may occur from any drug or anesthetic given. Unintended injury may occur to other pelvic or abdominal structures such as Fallopian tubes, ovaries, bladder, ureter (tube from kidney to bladder), or bowel. Nerves going from the pelvis to the legs may be injured. Any such injury may require immediate or later additional surgery to correct the problem. Excessive blood loss requiring transfusion is very unlikely but possible. Dangerous blood clots may form in the legs or lungs. Physical and sexual activity will be restricted in varying degrees for an indeterminate period of time but most often 2-4 weeks. She understands that the plan is to do this laparoscopically, however, there is a chance that this will need to be performed via a larger incision. Finally, she understands that it is impossible to list every possible undesirable effect and that the condition for which surgery is done is not always cured or significantly improved, and in rare cases may be even worsen. Ample time was given to answer all questions.  32 y.o. [id number]  with undesired fertility, desires permanent sterilization.  Other reversible forms of contraception were discussed with patient; she declines all other modalities. Permanent nature of as well as associated risks of the procedure discussed with patient including but not limited to: risk of regret, permanence of method, bleeding, infection, injury to surrounding organs and need for additional procedures.  Failure risk of 0.5-1% with increased risk of ectopic gestation if pregnancy  occurs was also discussed with patient.    2) Routine postoperative instructions were reviewed with the patient and her family in detail today including the expected length of recovery and likely postoperative course.  The patient concurred with the proposed plan, giving informed written consent for the surgery today.  Patient instructed on the importance of being NPO after midnight prior to her procedure.  If warranted preoperative prophylactic antibiotics and SCDs ordered on call to the OR to meet SCIP guidelines and adhere to recommendation laid forth in [place] Number 104 May 2009  "Antibiotic Prophylaxis for Gynecologic Procedures".     Malachy Mood, MD, [facility], [facility] 03/18/2018, 9:43 AM

## 2018-04-06 NOTE — Patient Instructions (Addendum)
Your procedure is scheduled on: 04-07-18 Report to Same Day Surgery 2nd floor medical mall [facility] Entrance-take elevator on left to 2nd floor.  Check in with surgery information desk.) To find out your arrival time please call [phone] between 1PM - 3PM on 04-06-18  Remember: Instructions that are not followed completely may result in serious medical risk, up to and including death, or upon the discretion of your surgeon and anesthesiologist your surgery may need to be rescheduled.    _x___ 1. Do not eat food after midnight the night before your procedure. NO GUM OR CANDY AFTER MIDNIGHT.  You may drink clear liquids up to 2 hours before you are scheduled to arrive at the hospital for your procedure.  Do not drink clear liquids within 2 hours of your scheduled arrival to the hospital.  Clear liquids include  --Water or Apple juice without pulp  --Clear carbohydrate beverage such as ClearFast or Gatorade  --Black Coffee or Clear Tea (No milk, no creamers, do not add anything to the coffee or Tea     __x__ 2. No Alcohol for 24 hours before or after surgery.   __x__3. No Smoking or e-cigarettes for 24 prior to surgery.  Do not use any chewable tobacco products for at least 6 hour prior to surgery   ____  4. Bring all medications with you on the day of surgery if instructed.    __x__ 5. Notify your doctor if there is any change in your medical condition     (cold, fever, infections).    x___6. On the morning of surgery brush your teeth with toothpaste and water.  You may rinse your mouth with mouth wash if you wish.  Do not swallow any toothpaste or mouthwash.   Do not wear jewelry, make-up, hairpins, clips or nail polish.  Do not wear lotions, powders, or perfumes. You may wear deodorant.  Do not shave 48 hours prior to surgery. Men may shave face and neck.  Do not bring valuables to the hospital.    [facility] is not responsible for any belongings or valuables.    Contacts, dentures or bridgework may not be worn into surgery.  Leave your suitcase in the car. After surgery it may be brought to your room.  For patients admitted to the hospital, discharge time is determined by your  treatment team.  _  Patients discharged the day of surgery will not be allowed to drive home.  You will need someone to drive you home and stay with you the night of your procedure.     ____ Take anti-hypertensive listed below, cardiac, seizure, asthma, anti-reflux and psychiatric medicines. These include:  1. NONE  2.  3.  4.  5.  6.  ____Fleets enema or Magnesium Citrate as directed.   ____ Use CHG Soap or sage wipes as directed on instruction sheet   ____ Use inhalers on the day of surgery and bring to hospital day of surgery  ____ Stop Metformin and Janumet 2 days prior to surgery.    ____ Take 1/2 of usual insulin dose the night before surgery and none on the morning surgery.   ____ Follow recommendations from Cardiologist, Pulmonologist or PCP regarding stopping Aspirin, Coumadin, Plavix ,Eliquis, Effient, or Pradaxa, and Pletal.  X____Stop Anti-inflammatories such as Advil, Aleve, Ibuprofen, Motrin, Naproxen, Naprosyn, Goodies powders or aspirin products 7 DAYS PRIOR TO SURGERY-OK to take Tylenol   ____ Stop supplements until after surgery. .   ____ Bring C-Pap  to the hospital.

## 2018-04-07 ENCOUNTER — Ambulatory Visit
Admission: RE | Admit: 2018-04-07 | Discharge: 2018-04-07 | Disposition: A | Discharge status: Home / Self Care | Payer: 59 | Source: Ambulatory Visit | Attending: Obstetrics and Gynecology | Admitting: Obstetrics and Gynecology

## 2018-04-07 ENCOUNTER — Ambulatory Visit: Payer: 59 | Admitting: Anesthesiology

## 2018-04-07 ENCOUNTER — Encounter
Admission: RE | Disposition: A | Discharge status: Home / Self Care | Payer: Self-pay | Source: Ambulatory Visit | Attending: Obstetrics and Gynecology

## 2018-04-07 ENCOUNTER — Other Ambulatory Visit: Payer: Self-pay

## 2018-04-07 ENCOUNTER — Encounter: Payer: Self-pay | Admitting: *Deleted

## 2018-04-07 DIAGNOSIS — Z9079 Acquired absence of other genital organ(s): Secondary | ICD-10-CM | POA: Insufficient documentation

## 2018-04-07 DIAGNOSIS — Z4589 Encounter for adjustment and management of other implanted devices: Secondary | ICD-10-CM

## 2018-04-07 DIAGNOSIS — N920 Excessive and frequent menstruation with regular cycle: Secondary | ICD-10-CM | POA: Insufficient documentation

## 2018-04-07 DIAGNOSIS — N719 Inflammatory disease of uterus, unspecified: Secondary | ICD-10-CM | POA: Insufficient documentation

## 2018-04-07 DIAGNOSIS — Z302 Encounter for sterilization: Secondary | ICD-10-CM | POA: Diagnosis not present

## 2018-04-07 DIAGNOSIS — Z85038 Personal history of other malignant neoplasm of large intestine: Secondary | ICD-10-CM | POA: Insufficient documentation

## 2018-04-07 DIAGNOSIS — Z9889 Other specified postprocedural states: Secondary | ICD-10-CM

## 2018-04-07 DIAGNOSIS — F17200 Nicotine dependence, unspecified, uncomplicated: Secondary | ICD-10-CM | POA: Diagnosis not present

## 2018-04-07 HISTORY — PX: HYSTEROSCOPY WITH D & C: SHX1775

## 2018-04-07 HISTORY — PX: LAPAROSCOPIC UNILATERAL SALPINGECTOMY: SHX5934

## 2018-04-07 HISTORY — PX: IUD REMOVAL: SHX5392

## 2018-04-07 LAB — POCT PREGNANCY, URINE: Preg Test, Ur: NEGATIVE

## 2018-04-07 SURGERY — SALPINGECTOMY, UNILATERAL, LAPAROSCOPIC
Anesthesia: General

## 2018-04-07 MED ORDER — HYDROMORPHONE HCL 1 MG/ML IJ SOLN
INTRAMUSCULAR | Status: AC
  Filled 2018-04-07: qty 1

## 2018-04-07 MED ORDER — HYDROCODONE-ACETAMINOPHEN 5-325 MG PO TABS: 1.0000 | ORAL_TABLET | ORAL | 0 refills | Status: DC | PRN

## 2018-04-07 MED ORDER — ACETAMINOPHEN NICU IV SYRINGE 10 MG/ML
INTRAVENOUS | Status: AC
  Filled 2018-04-07: qty 1

## 2018-04-07 MED ORDER — GLYCOPYRROLATE 0.2 MG/ML IJ SOLN
INTRAMUSCULAR | Status: DC | PRN
  Administered 2018-04-07: 0.1 mg via INTRAVENOUS

## 2018-04-07 MED ORDER — HYDROMORPHONE HCL 1 MG/ML IJ SOLN
INTRAMUSCULAR | Status: DC | PRN
  Administered 2018-04-07 (×2): 1 mg via INTRAVENOUS

## 2018-04-07 MED ORDER — PROPOFOL 10 MG/ML IV BOLUS
INTRAVENOUS | Status: DC | PRN
  Administered 2018-04-07: 150 mg via INTRAVENOUS
  Administered 2018-04-07: 50 mg via INTRAVENOUS

## 2018-04-07 MED ORDER — OXYCODONE HCL 5 MG/5ML PO SOLN: 5.0000 mg | Freq: Once | ORAL | Status: AC | PRN

## 2018-04-07 MED ORDER — LACTATED RINGERS IV SOLN
INTRAVENOUS | Status: DC
  Administered 2018-04-07: 10:00:00 via INTRAVENOUS

## 2018-04-07 MED ORDER — FAMOTIDINE 20 MG PO TABS
20.0000 mg | ORAL_TABLET | Freq: Once | ORAL | Status: AC
  Administered 2018-04-07: 20 mg via ORAL

## 2018-04-07 MED ORDER — KETOROLAC TROMETHAMINE 30 MG/ML IJ SOLN
INTRAMUSCULAR | Status: DC | PRN
  Administered 2018-04-07: 30 mg via INTRAVENOUS

## 2018-04-07 MED ORDER — ROCURONIUM BROMIDE 50 MG/5ML IV SOLN
INTRAVENOUS | Status: AC
  Filled 2018-04-07: qty 1

## 2018-04-07 MED ORDER — MIDAZOLAM HCL 2 MG/2ML IJ SOLN
INTRAMUSCULAR | Status: DC | PRN
  Administered 2018-04-07: 4 mg via INTRAVENOUS

## 2018-04-07 MED ORDER — FAMOTIDINE 20 MG PO TABS
ORAL_TABLET | ORAL | Status: AC
  Administered 2018-04-07: 20 mg via ORAL
  Filled 2018-04-07: qty 1

## 2018-04-07 MED ORDER — BUPIVACAINE HCL (PF) 0.5 % IJ SOLN
INTRAMUSCULAR | Status: AC
  Filled 2018-04-07: qty 30

## 2018-04-07 MED ORDER — GLYCOPYRROLATE 0.2 MG/ML IJ SOLN
INTRAMUSCULAR | Status: AC
  Filled 2018-04-07: qty 1

## 2018-04-07 MED ORDER — FENTANYL CITRATE (PF) 100 MCG/2ML IJ SOLN
INTRAMUSCULAR | Status: AC
  Administered 2018-04-07: 25 ug via INTRAVENOUS
  Filled 2018-04-07: qty 2

## 2018-04-07 MED ORDER — IBUPROFEN 600 MG PO TABS: 600.0000 mg | ORAL_TABLET | Freq: Four times a day (QID) | ORAL | 3 refills | Status: DC | PRN

## 2018-04-07 MED ORDER — SEVOFLURANE IN SOLN
RESPIRATORY_TRACT | Status: AC
Start: 2018-04-07 — End: 2018-04-07
  Filled 2018-04-07: qty 250

## 2018-04-07 MED ORDER — PROPOFOL 10 MG/ML IV BOLUS
INTRAVENOUS | Status: AC
  Filled 2018-04-07: qty 20

## 2018-04-07 MED ORDER — ACETAMINOPHEN 10 MG/ML IV SOLN
INTRAVENOUS | Status: DC | PRN
Start: 2018-04-07 — End: 2018-04-07
  Administered 2018-04-07: 1000 mg via INTRAVENOUS

## 2018-04-07 MED ORDER — MIDAZOLAM HCL 2 MG/2ML IJ SOLN
INTRAMUSCULAR | Status: AC
  Filled 2018-04-07: qty 4

## 2018-04-07 MED ORDER — OXYCODONE HCL 5 MG PO TABS
ORAL_TABLET | ORAL | Status: AC
  Filled 2018-04-07: qty 1

## 2018-04-07 MED ORDER — KETAMINE HCL 10 MG/ML IJ SOLN
INTRAMUSCULAR | Status: DC | PRN
  Administered 2018-04-07: 37.5 mg via INTRAVENOUS
  Administered 2018-04-07: 12.5 mg via INTRAVENOUS

## 2018-04-07 MED ORDER — LIDOCAINE HCL (CARDIAC) PF 100 MG/5ML IV SOSY
PREFILLED_SYRINGE | INTRAVENOUS | Status: DC | PRN
  Administered 2018-04-07: 50 mg via INTRAVENOUS

## 2018-04-07 MED ORDER — OXYCODONE HCL 5 MG PO TABS
5.0000 mg | ORAL_TABLET | Freq: Once | ORAL | Status: AC | PRN
  Administered 2018-04-07: 5 mg via ORAL

## 2018-04-07 MED ORDER — DEXAMETHASONE SODIUM PHOSPHATE 10 MG/ML IJ SOLN
INTRAMUSCULAR | Status: AC
  Filled 2018-04-07: qty 1

## 2018-04-07 MED ORDER — ROCURONIUM BROMIDE 100 MG/10ML IV SOLN
INTRAVENOUS | Status: DC | PRN
  Administered 2018-04-07: 40 mg via INTRAVENOUS

## 2018-04-07 MED ORDER — LIDOCAINE HCL (PF) 2 % IJ SOLN
INTRAMUSCULAR | Status: AC
  Filled 2018-04-07: qty 10

## 2018-04-07 MED ORDER — SUGAMMADEX SODIUM 200 MG/2ML IV SOLN
INTRAVENOUS | Status: DC | PRN
  Administered 2018-04-07: 150 mg via INTRAVENOUS

## 2018-04-07 MED ORDER — DEXAMETHASONE SODIUM PHOSPHATE 10 MG/ML IJ SOLN
INTRAMUSCULAR | Status: DC | PRN
  Administered 2018-04-07: 10 mg via INTRAVENOUS

## 2018-04-07 MED ORDER — ONDANSETRON HCL 4 MG/2ML IJ SOLN
INTRAMUSCULAR | Status: DC | PRN
  Administered 2018-04-07: 4 mg via INTRAVENOUS

## 2018-04-07 MED ORDER — BUPIVACAINE HCL 0.5 % IJ SOLN
INTRAMUSCULAR | Status: DC | PRN
  Administered 2018-04-07: 15 mL

## 2018-04-07 MED ORDER — SUGAMMADEX SODIUM 200 MG/2ML IV SOLN
INTRAVENOUS | Status: AC
  Filled 2018-04-07: qty 2

## 2018-04-07 MED ORDER — ONDANSETRON HCL 4 MG/2ML IJ SOLN
INTRAMUSCULAR | Status: AC
  Filled 2018-04-07: qty 2

## 2018-04-07 MED ORDER — FENTANYL CITRATE (PF) 100 MCG/2ML IJ SOLN
25.0000 ug | INTRAMUSCULAR | Status: DC | PRN
  Administered 2018-04-07 (×4): 25 ug via INTRAVENOUS

## 2018-04-07 SURGICAL SUPPLY — 38 items
ANCHOR TIS RET SYS 235ML (MISCELLANEOUS) IMPLANT
BAG URINE DRAINAGE (UROLOGICAL SUPPLIES) ×5 IMPLANT
BLADE SURG SZ11 CARB STEEL (BLADE) ×5 IMPLANT
CANISTER SUCT 1200ML W/VALVE (MISCELLANEOUS) ×5 IMPLANT
CATH FOLEY 2WAY  5CC 16FR (CATHETERS) ×2
CATH ROBINSON RED A/P 16FR (CATHETERS) ×5 IMPLANT
CATH URTH 16FR FL 2W BLN LF (CATHETERS) ×3 IMPLANT
CHLORAPREP W/TINT 26ML (MISCELLANEOUS) ×5 IMPLANT
DERMABOND ADVANCED (GAUZE/BANDAGES/DRESSINGS) ×2
DERMABOND ADVANCED .7 DNX12 (GAUZE/BANDAGES/DRESSINGS) ×3 IMPLANT
GLOVE BIO SURGEON STRL SZ7 (GLOVE) ×5 IMPLANT
GLOVE INDICATOR 7.5 STRL GRN (GLOVE) ×5 IMPLANT
GOWN STRL REUS W/ TWL LRG LVL3 (GOWN DISPOSABLE) ×6 IMPLANT
GOWN STRL REUS W/ TWL XL LVL3 (GOWN DISPOSABLE) IMPLANT
GOWN STRL REUS W/TWL LRG LVL3 (GOWN DISPOSABLE) ×4
GOWN STRL REUS W/TWL XL LVL3 (GOWN DISPOSABLE)
GRASPER SUT TROCAR 14GX15 (MISCELLANEOUS) IMPLANT
IRRIGATION STRYKERFLOW (MISCELLANEOUS) IMPLANT
IRRIGATOR STRYKERFLOW (MISCELLANEOUS)
IV LACTATED RINGERS 1000ML (IV SOLUTION) ×5 IMPLANT
KIT PINK PAD W/HEAD ARE REST (MISCELLANEOUS) ×5
KIT PINK PAD W/HEAD ARM REST (MISCELLANEOUS) ×3 IMPLANT
KIT TURNOVER CYSTO (KITS) ×5 IMPLANT
LABEL OR SOLS (LABEL) ×5 IMPLANT
NS IRRIG 500ML POUR BTL (IV SOLUTION) ×5 IMPLANT
PACK DNC HYST (MISCELLANEOUS) ×5 IMPLANT
PACK GYN LAPAROSCOPIC (MISCELLANEOUS) ×5 IMPLANT
PAD OB MATERNITY 4.3X12.25 (PERSONAL CARE ITEMS) ×5 IMPLANT
PAD PREP 24X41 OB/GYN DISP (PERSONAL CARE ITEMS) ×5 IMPLANT
SCISSORS METZENBAUM CVD 33 (INSTRUMENTS) IMPLANT
SHEARS HARMONIC ACE PLUS 36CM (ENDOMECHANICALS) IMPLANT
SLEEVE ENDOPATH XCEL 5M (ENDOMECHANICALS) ×5 IMPLANT
SUT MNCRL AB 4-0 PS2 18 (SUTURE) ×5 IMPLANT
SUT VIC AB 2-0 UR6 27 (SUTURE) IMPLANT
TOWEL OR 17X26 4PK STRL BLUE (TOWEL DISPOSABLE) ×5 IMPLANT
TROCAR ENDO BLADELESS 11MM (ENDOMECHANICALS) ×5 IMPLANT
TROCAR XCEL NON-BLD 5MMX100MML (ENDOMECHANICALS) ×5 IMPLANT
TUBING INSUFFLATION (TUBING) ×5 IMPLANT

## 2018-04-07 NOTE — Anesthesia Preprocedure Evaluation (Signed)
Anesthesia Evaluation  Patient identified by MRN, date of birth, ID band Patient awake    Reviewed: Allergy & Precautions, H&P , NPO status , Patient's Chart, lab work & pertinent test results  History of Anesthesia Complications Negative for: history of anesthetic complications  Airway Mallampati: I  TM Distance: >3 FB Neck ROM: full    Dental  (+) Chipped   Pulmonary neg shortness of breath, Current Smoker,           Cardiovascular Exercise Tolerance: Good (-) angina+ Peripheral Vascular Disease  (-) Past MI and (-) DOE negative cardio ROS       Neuro/Psych  Headaches, negative psych ROS   GI/Hepatic negative GI ROS, Neg liver ROS,   Endo/Other  negative endocrine ROS  Renal/GU      Musculoskeletal   Abdominal   Peds  Hematology negative hematology ROS (+)   Anesthesia Other Findings Past Medical History: No date: Benign carcinoid tumor of appendix No date: [facility])     Comment:  melanoma/appendix 08/11/2012: Ectopic pregnancy No date: Headache     Comment:  migraines No date: History of kidney stones     Comment:  h/o No date: Hot flashes No date: Irregular heart beat  Past Surgical History: No date: APPENDECTOMY 2003: COLPOSCOPY 08/11/2012: COMBINED HYSTEROSCOPY DIAGNOSTIC / D&C 08/11/2012, 08/2012: LAPAROSCOPY     Comment:  Right salpingostomy Ectopic pregnancy by Klett, 08/2012               Right Salpingectomy remaining ectopic No date: MELANOMA EXCISION 08/2012: SALPINGECTOMY  BMI    Body Mass Index:  26.26 kg/m      Reproductive/Obstetrics negative OB ROS                             Anesthesia Physical Anesthesia Plan  ASA: II  Anesthesia Plan: General ETT   Post-op Pain Management:    Induction: Intravenous  PONV Risk Score and Plan: Ondansetron, Dexamethasone and Midazolam  Airway Management Planned: Oral ETT  Additional Equipment:   Intra-op  Plan:   Post-operative Plan: Extubation in OR  Informed Consent: I have reviewed the patients History and Physical, chart, labs and discussed the procedure including the risks, benefits and alternatives for the proposed anesthesia with the patient or authorized representative who has indicated his/her understanding and acceptance.   Dental Advisory Given  Plan Discussed with: Anesthesiologist, CRNA and Surgeon  Anesthesia Plan Comments: (Patient consented for risks of anesthesia including but not limited to:  - adverse reactions to medications - damage to teeth, lips or other oral mucosa - sore throat or hoarseness - Damage to heart, brain, lungs or loss of life  Patient voiced understanding.)        Anesthesia Quick Evaluation

## 2018-04-07 NOTE — Op Note (Signed)
Preoperative Diagnosis: 1) 32 y.o. with retained Mirena IUD 2) Undesired fertility 3) History of mucinous carcinoma of the appendix 4) Abnormal uterine bleeding  Postoperative Diagnosis: 1) 32 y.o. with retained Mirena IUD 2) Undesired fertility 3) History of mucinous carcinoma of the appendix 4) Abnormal uterine bleeding  Operation Performed: Mirena IUD removal, hysteroscopy, dilation and curettage, laparoscopic left salpingectomy, pelvic washings  Indication: 32 y.o. [id number]  with undesired fertility, desires permanent sterilization.  Abnormal uterine bleeding with inability to visualized or remove IUD in office.    Surgeon: Malachy Mood, MD  Anesthesia: General  Preoperative Antibiotics: none  Estimated Blood Loss: 5 mL  Drains or Tubes: none  Implants: none  Specimens Removed: none  Complications: none  Intraoperative Findings: Normal left tube, normal ovaries, and normal uterus and cervix. Surgically absent right tube.  Normal cul de sac.  Normal liver edge.  The cecum was visualized and appeared normal with some filmy adhesions at the site of prior appendectomy.  Patient Condition: stable  Procedure in Detail:  Patient was taken to the operating room were she was administered general endotracheal anesthesia.  She was positioned in the dorsal lithotomy position utilizing Allen stirups, prepped and draped in the usual sterile fashion.  Uterus was noted to be small in size, anteverted.   Prior to proceeding with the case a time out was performed.  Attention was turned to the patient's pelvis.  A red rubber catheter was used to empty the patient's bladder.  An operative speculum was placed to allow visualization of the cervix.  IUD was removed using a ring forceps, string visualized just past the external cervical os.  The anterior lip of the cervix was grasped with a single tooth tenaculum and the cervix was sequentially dilated using pratt dilators.  The hysteroscope was  then advanced into the uterine cavity noting the above findings.  Sharp curettage was performed and the resulting specimen collected and sent to pathology.  The anterior lip of the cervix was grasped with a single tooth tenaculum, and a Hulka tenaculum was placed to allow manipulation of the uterus.  The operative speculum and single tooth tenaculum were then removed.  Attention was turned to the patient's abdomen.  The umbilicus was infiltrated with 1% Sensorcaine, before making a stab incision using an 11 blade scalpel.  A 64mm Excel trocar was then used to gain direct entry into the peritoneal cavity utilizing the camera to visualize progress of the trocar during placement.  Once peritoneal entry had been achieved, insufflation was started and pneumoperitoneum established at a pressure of 96mmHg.   General inspection of the abdomen revealed the above noted findings.  A spot 2cm midline above the pubic symphysis was injected with 1% Sensorcaine and a stab incision was made using an 11 blade scalpel.  The 21mm Excel trocar was place in the left lower quadrant and a 37mm Excel trocar in the right lower quadrant under direct visualization.  Pelvic washings were obtained.  The right tube was surgically absent, the right ovary appeared normal.  The cecum was inspected and normal at the site of prior appendectomy with some light filmy adhesions.  The left tube was identified and transected from it's attachments to the ovary, mesosalpinx, and uterus using a 53mm harmonic.  Pedicles were noted to be hemostatic.  Carter-Thompson was used to close the 45mm port site using 0 Vicryl under direct visualization.  Pneumoperitoneum was evacuated.  The trocars were removed.  The 82mm trocar site was closed with  4-0 Monocryl in a subcuticular fashion.  All trocar sites were then dressed with surgical skin glue.  The Hulka tenaculum was removed.  Sponge needle and instrument counts were correct time two.  The patient tolerated the  procedure well and was taken to the recovery room in stable condition.

## 2018-04-07 NOTE — OR Nursing (Signed)
Discussed discharge instructions with pt and husband. Both voice understanding. 

## 2018-04-07 NOTE — Discharge Instructions (Signed)

## 2018-04-07 NOTE — Transfer of Care (Signed)
Immediate Anesthesia Transfer of Care Note  Patient: Tracey Watson  Procedure(s) Performed: LAPAROSCOPIC LEFT SALPINGECTOMY (Left ) INTRAUTERINE DEVICE (IUD) REMOVAL (N/A ) DILATATION AND CURETTAGE /HYSTEROSCOPY  Patient Location: PACU  Anesthesia Type:General  Level of Consciousness: awake, alert , oriented and patient cooperative  Airway & Oxygen Therapy: Patient Spontanous Breathing  Post-op Assessment: Report given to RN, Post -op Vital signs reviewed and stable and Patient moving all extremities  Post vital signs: Reviewed and stable  Last Vitals:  Vitals Value Taken Time  BP 105/66 04/07/2018 12:24 PM  Temp    Pulse 63 04/07/2018 12:26 PM  Resp 16 04/07/2018 12:26 PM  SpO2 98 % 04/07/2018 12:26 PM  Vitals shown include unvalidated device data.  Last Pain:  Vitals:   04/07/18 0923  TempSrc: Temporal  PainSc: 0-No pain         Complications: No apparent anesthesia complications

## 2018-04-07 NOTE — Anesthesia Post-op Follow-up Note (Signed)
Anesthesia QCDR form completed.        

## 2018-04-07 NOTE — Anesthesia Procedure Notes (Signed)
Procedure Name: Intubation Date/Time: 04/07/2018 11:06 AM Performed by: Nile Riggs, CRNA Pre-anesthesia Checklist: Patient identified, Emergency Drugs available, Suction available, Patient being monitored and Timeout performed Patient Re-evaluated:Patient Re-evaluated prior to induction Oxygen Delivery Method: Circle system utilized Preoxygenation: Pre-oxygenation with 100% oxygen Induction Type: IV induction Ventilation: Mask ventilation without difficulty Laryngoscope Size: Miller and 2 Grade View: Grade II Tube type: Oral Tube size: 7.5 mm Number of attempts: 1 Airway Equipment and Method: Stylet Placement Confirmation: ETT inserted through vocal cords under direct vision,  positive ETCO2,  CO2 detector and breath sounds checked- equal and bilateral Secured at: 19 cm Tube secured with: Tape Dental Injury: Teeth and Oropharynx as per pre-operative assessment

## 2018-04-07 NOTE — H&P (Signed)
Date of Initial H&P: 03/18/18  History reviewed, patient examined, no change in status, stable for surgery.

## 2018-04-08 ENCOUNTER — Encounter: Payer: Self-pay | Admitting: Obstetrics and Gynecology

## 2018-04-08 LAB — SURGICAL PATHOLOGY

## 2018-04-08 LAB — CYTOLOGY - NON PAP

## 2018-04-08 NOTE — Anesthesia Postprocedure Evaluation (Signed)
Anesthesia Post Note  Patient: [name]  Procedure(s) Performed: LAPAROSCOPIC LEFT SALPINGECTOMY (Left ) INTRAUTERINE DEVICE (IUD) REMOVAL (N/A ) DILATATION AND CURETTAGE /HYSTEROSCOPY  Patient location during evaluation: PACU Anesthesia Type: General Level of consciousness: awake and alert Pain management: pain level controlled Vital Signs Assessment: post-procedure vital signs reviewed and stable Respiratory status: spontaneous breathing, nonlabored ventilation, respiratory function stable and patient connected to nasal cannula oxygen Cardiovascular status: blood pressure returned to baseline and stable Postop Assessment: no apparent nausea or vomiting Anesthetic complications: no     Last Vitals:  Vitals:   04/07/18 1312 04/07/18 1321  BP: 104/62 (!) 103/59  Pulse: (!) 59   Resp: 16   Temp: 36.9 C   SpO2: 100%     Last Pain:  Vitals:   04/07/18 1334  TempSrc:   PainSc: 3                  Tracey Watson

## 2018-04-20 ENCOUNTER — Encounter: Payer: Self-pay | Admitting: Obstetrics and Gynecology

## 2018-04-20 ENCOUNTER — Ambulatory Visit (INDEPENDENT_AMBULATORY_CARE_PROVIDER_SITE_OTHER): Payer: 59 | Admitting: Obstetrics and Gynecology

## 2018-04-20 VITALS — BP 112/62 | HR 74 | Ht 64.0 in | Wt 150.0 lb

## 2018-04-20 DIAGNOSIS — N711 Chronic inflammatory disease of uterus: Secondary | ICD-10-CM

## 2018-04-20 DIAGNOSIS — Z4889 Encounter for other specified surgical aftercare: Secondary | ICD-10-CM

## 2018-04-20 MED ORDER — DOXYCYCLINE HYCLATE 100 MG PO CAPS: 100.0000 mg | ORAL_CAPSULE | Freq: Two times a day (BID) | ORAL | 0 refills | Status: AC

## 2018-04-20 NOTE — Progress Notes (Signed)
Postoperative Follow-up Patient presents post op from laparoscopic left salpingectomy, pelvic washing, hysteroscopy, D&C, and removal or retained Mirena IUD ago for requested sterilization and retained IUD and AUB.  Subjective: Patient reports some improvement in her preop symptoms. Eating a regular diet without difficulty. Pain is controlled without any medications.  Activity: normal activities of daily living.  Having some pain and LLQ at site of 8mm port site.  Objective: Vitals: Blood pressure 112/62, pulse 74, height 5\' 4"  (1.626 m), weight 150 lb (68 kg).   Gen: NAD HEENT: normocephalic, anicteric Pulmonary: no increased work of breathing Abdomen: soft, non-tender, non-distended, trocar sites D/C/I Ext: no edema  Admission on 04/07/2018, Discharged on 04/07/2018  Component Date Value Ref Range Status  . Preg Test, Ur 04/07/2018 NEGATIVE  NEGATIVE Final   Comment:        THE SENSITIVITY OF THIS METHODOLOGY IS >24 mIU/mL   . CYTOLOGY - NON GYN 04/07/2018    Final                   Value:Cytology - Non PAP CASE: [id number] PATIENT: Tracey Watson Non-Gyn Cytology Report     SPECIMEN SUBMITTED: A. 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     DIAGNOSIS: A. PELVIC WASHINGS: - NEGATIVE FOR MALIGNANCY. - MACROPHAGES AND MESOTHELIAL CELLS  Comment: Slides reviewed: 2 cytospin slides, 1 ThinPrep slide, and 1 cell block   GROSS DESCRIPTION: A. Labeled: Pelvic washings Received: Fresh Volume: 20 mL Description: Translucent pink fluid in a 120 mL container Submitted for one ThinPrep and one cell block   Final Diagnosis performed by Bryan Lemma, MD.   Electronically signed 04/08/2018 3:53:32PM The electronic  signature indicates that the named Attending Pathologist has evaluated the specimen  Tech                         nical component performed at [facility], [address] [place], [place] [address] Lab: [phone] Dir: Rush Farmer, MD, MMM  Professional component performed at [facility], [facility], [place], [place], [place] [address] Lab: [phone] Dir: Dellia Nims. Reuel Derby, MD   . SURGICAL PATHOLOGY 04/07/2018    Final                   Value:Surgical Pathology CASE: [id number] PATIENT: Tracey Watson Surgical Pathology Report     SPECIMEN SUBMITTED: A. Endometrial curettings B. 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     DIAGNOSIS: A. ENDOMETRIUM; CURETTAGE: - PROGESTIN EFFECT AND CHRONIC ENDOMETRITIS. - NEGATIVE FOR ATYPIA AND MALIGNANCY.  B. FALLOPIAN TUBE, LEFT; SALPINGECTOMY FOR STERILIZATION: - NO PATHOLOGIC CHANGES; COMPLETE CROSS SECTION OF LUMEN PRESENT.   GROSS DESCRIPTION: A. Labeled: Endometrial curettings Received: In formalin Tissue fragment(s): Multiple Size: Aggregate, 2.2 x 1.8 x 0.1 cm Description: Blood clot and pink-red fragments Entirely submitted in one cassette  B. Labeled: Left fallopian tube Received: In forma                         lin Size: 5.3 x 1.2 x 1.1 and 0.6 x 0.5 x 0.4  cm Other findings: Purple to tan tubular fragments, longest fragment has fimbria  Block summary: 1 -representative cross-sections and longitudinal fimbriated end      Final Diagnosis performed by Bryan Lemma, MD.   Electronically signed 04/08/2018 10:46:56AM The electronic signature indicates that the named Attending Pathologist has evaluated the specimen  Technical component  performed at [facility], [address], [place], [place] [address] Lab: [phone] Dir: Rush Farmer, MD, MMM  Professional component performed at [facility], [facility], [place], [place], [place] [address] Lab: [phone] Dir: Dellia Nims. Rubinas, MD     Assessment: 32 y.o. s/p laparoscopic left salpingectomy, pelvic washing, hysteroscopy, D&C, and removal or retained Mirena IUD stable  Plan: Patient has done well after surgery with no apparent complications.  I have discussed the post-operative course to date, and the expected progress moving forward.  The patient understands what complications to be concerned about.  I will see the patient in routine follow up, or sooner if needed.    Activity plan: No restriction.  Doxycyline Rx for finding of chronic endometritis    Malachy Mood, MD, [place], [facility] 04/20/2018, 10:47 AM

## 2018-04-25 ENCOUNTER — Ambulatory Visit: Payer: 59 | Admitting: Obstetrics and Gynecology

## 2018-05-18 ENCOUNTER — Ambulatory Visit (INDEPENDENT_AMBULATORY_CARE_PROVIDER_SITE_OTHER): Payer: 59 | Admitting: Obstetrics and Gynecology

## 2018-05-18 ENCOUNTER — Encounter: Payer: Self-pay | Admitting: Obstetrics and Gynecology

## 2018-05-18 VITALS — BP 110/82 | HR 72 | Temp 98.7°F | Wt 153.0 lb

## 2018-05-18 DIAGNOSIS — Z113 Encounter for screening for infections with a predominantly sexual mode of transmission: Secondary | ICD-10-CM | POA: Diagnosis not present

## 2018-05-18 DIAGNOSIS — N949 Unspecified condition associated with female genital organs and menstrual cycle: Secondary | ICD-10-CM

## 2018-05-18 MED ORDER — HYDROCODONE-ACETAMINOPHEN 5-325 MG PO TABS: 1.0000 | ORAL_TABLET | Freq: Four times a day (QID) | ORAL | 0 refills | Status: DC | PRN

## 2018-05-18 MED ORDER — LIDOCAINE HCL URETHRAL/MUCOSAL 2 % EX GEL: 1.0000 "application " | CUTANEOUS | 2 refills | Status: DC | PRN

## 2018-05-18 MED ORDER — VALACYCLOVIR HCL 1 G PO TABS: 1000.0000 mg | ORAL_TABLET | Freq: Every day | ORAL | 6 refills | Status: DC
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
## 2018-05-19 ENCOUNTER — Telehealth: Payer: Self-pay

## 2018-05-19 LAB — HEP, RPR, HIV PANEL
HIV Screen 4th Generation wRfx: NONREACTIVE
Hepatitis B Surface Ag: NEGATIVE
RPR: NONREACTIVE

## 2018-05-19 NOTE — Telephone Encounter (Signed)
Pt requesting lab results from yesterday with [facility]. She is aware of neg bloodwork and aware that swab will take about a week to come back and they will let her know once that comes back. Pt appreciative

## 2018-05-26 LAB — NUSWAB VG+, HSV
CANDIDA ALBICANS, NAA: NEGATIVE
CANDIDA GLABRATA, NAA: NEGATIVE
Chlamydia trachomatis, NAA: NEGATIVE
HSV 1 NAA: NEGATIVE
HSV 2 NAA: POSITIVE — AB
NEISSERIA GONORRHOEAE, NAA: NEGATIVE
Trich vag by NAA: NEGATIVE

## 2018-06-01 ENCOUNTER — Ambulatory Visit: Payer: 59 | Admitting: Obstetrics and Gynecology

## 2018-09-28 DIAGNOSIS — S92343A Displaced fracture of fourth metatarsal bone, unspecified foot, initial encounter for closed fracture: Secondary | ICD-10-CM | POA: Insufficient documentation

## 2018-09-28 DIAGNOSIS — S93409A Sprain of unspecified ligament of unspecified ankle, initial encounter: Secondary | ICD-10-CM | POA: Insufficient documentation

## 2018-09-28 DIAGNOSIS — S83419A Sprain of medial collateral ligament of unspecified knee, initial encounter: Secondary | ICD-10-CM | POA: Insufficient documentation

## 2018-12-27 ENCOUNTER — Ambulatory Visit: Payer: 59 | Admitting: Obstetrics and Gynecology

## 2019-01-13 ENCOUNTER — Ambulatory Visit: Payer: Self-pay | Admitting: Nurse Practitioner

## 2019-01-13 ENCOUNTER — Encounter: Payer: Self-pay | Admitting: Nurse Practitioner

## 2019-01-13 VITALS — BP 123/68 | HR 90 | Resp 16 | Ht 64.0 in | Wt 145.6 lb

## 2019-01-13 DIAGNOSIS — F411 Generalized anxiety disorder: Secondary | ICD-10-CM

## 2019-01-13 DIAGNOSIS — J302 Other seasonal allergic rhinitis: Secondary | ICD-10-CM | POA: Insufficient documentation

## 2019-01-13 DIAGNOSIS — E039 Hypothyroidism, unspecified: Secondary | ICD-10-CM | POA: Diagnosis not present

## 2019-01-13 DIAGNOSIS — R5383 Other fatigue: Secondary | ICD-10-CM

## 2019-01-13 MED ORDER — BUSPIRONE HCL 5 MG PO TABS: 5.0000 mg | ORAL_TABLET | Freq: Two times a day (BID) | ORAL | 2 refills | Status: DC | PRN

## 2019-01-13 MED ORDER — ALPRAZOLAM 0.25 MG PO TABS: 0.2500 mg | ORAL_TABLET | Freq: Every evening | ORAL | 2 refills | Status: DC | PRN
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
## 2019-01-22 DIAGNOSIS — E039 Hypothyroidism, unspecified: Secondary | ICD-10-CM | POA: Insufficient documentation

## 2019-01-22 DIAGNOSIS — R5383 Other fatigue: Secondary | ICD-10-CM | POA: Insufficient documentation

## 2019-01-22 DIAGNOSIS — F411 Generalized anxiety disorder: Secondary | ICD-10-CM | POA: Insufficient documentation

## 2019-03-01 ENCOUNTER — Encounter: Payer: Self-pay | Admitting: Nurse Practitioner

## 2019-03-10 ENCOUNTER — Ambulatory Visit (INDEPENDENT_AMBULATORY_CARE_PROVIDER_SITE_OTHER): Payer: 59 | Admitting: Obstetrics and Gynecology

## 2019-03-10 ENCOUNTER — Other Ambulatory Visit (HOSPITAL_COMMUNITY)
Admission: RE | Admit: 2019-03-10 | Discharge: 2019-03-10 | Disposition: A | Discharge status: Home / Self Care | Payer: 59 | Source: Ambulatory Visit | Attending: Obstetrics and Gynecology | Admitting: Obstetrics and Gynecology

## 2019-03-10 ENCOUNTER — Other Ambulatory Visit: Payer: Self-pay

## 2019-03-10 ENCOUNTER — Encounter: Payer: Self-pay | Admitting: Obstetrics and Gynecology

## 2019-03-10 VITALS — BP 110/70 | HR 87 | Ht 64.0 in | Wt 143.0 lb

## 2019-03-10 DIAGNOSIS — Z01419 Encounter for gynecological examination (general) (routine) without abnormal findings: Secondary | ICD-10-CM

## 2019-03-10 DIAGNOSIS — B373 Candidiasis of vulva and vagina: Secondary | ICD-10-CM

## 2019-03-10 DIAGNOSIS — Z0000 Encounter for general adult medical examination without abnormal findings: Secondary | ICD-10-CM | POA: Insufficient documentation

## 2019-03-10 DIAGNOSIS — Z30011 Encounter for initial prescription of contraceptive pills: Secondary | ICD-10-CM

## 2019-03-10 DIAGNOSIS — B3731 Acute candidiasis of vulva and vagina: Secondary | ICD-10-CM

## 2019-03-10 DIAGNOSIS — Z113 Encounter for screening for infections with a predominantly sexual mode of transmission: Secondary | ICD-10-CM

## 2019-03-10 DIAGNOSIS — N920 Excessive and frequent menstruation with regular cycle: Secondary | ICD-10-CM

## 2019-03-10 MED ORDER — TAYTULLA 1-20 MG-MCG(24) PO CAPS: 1.0000 | ORAL_CAPSULE | Freq: Every day | ORAL | 11 refills | Status: DC

## 2019-03-10 MED ORDER — FLUCONAZOLE 150 MG PO TABS: 150.0000 mg | ORAL_TABLET | Freq: Once | ORAL | 3 refills | Status: AC

## 2019-03-10 NOTE — Progress Notes (Signed)
Gynecology Annual Exam   PCP: Ronnell Freshwater, NP  Chief Complaint:  Chief Complaint  Patient presents with  . Gynecologic Exam    Poss yeast infection,     History of Present Illness: Patient is a 33 y.o. G2P1011 presents for annual exam. The patient has no complaints today.   LMP: Patient's last menstrual period was 02/12/2019 (approximate). Average Interval: regular, 28 days Duration of flow: 5 days Heavy Menses: yes Clots: no, but changes tampon sometimes twice an hour, has frequent accidents.  Intermenstrual Bleeding: no Postcoital Bleeding: no Dysmenorrhea: no  The patient is sexually active. She currently uses bilateral salpingectomy for contraception. She denies dyspareunia.  The patient does perform self breast exams.  There is no notable family history of breast or ovarian cancer in her family.  The patient wears seatbelts: yes.   The patient has regular exercise: no.    The patient denies current symptoms of depression.    Review of Systems: Review of Systems  Constitutional: Negative for chills, fever, malaise/fatigue and weight loss.  HENT: Negative for congestion, hearing loss and sinus pain.   Eyes: Negative for blurred vision and double vision.  Respiratory: Negative for cough, sputum production, shortness of breath and wheezing.   Cardiovascular: Negative for chest pain, palpitations, orthopnea and leg swelling.  Gastrointestinal: Negative for abdominal pain, constipation, diarrhea, nausea and vomiting.  Genitourinary: Negative for dysuria, flank pain, frequency, hematuria and urgency.  Musculoskeletal: Negative for back pain, falls and joint pain.  Skin: Negative for itching and rash.  Neurological: Negative for dizziness and headaches.  Psychiatric/Behavioral: Negative for depression, substance abuse and suicidal ideas. The patient is not nervous/anxious.     Past Medical History:  Past Medical History:  Diagnosis Date  . Anxiety   . Benign  carcinoid tumor of appendix   . 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 Ectopic pregnancy 08/11/2012  . Headache    migraines  . History of kidney stones    h/o  . Hot flashes   . Irregular heart beat     Past Surgical History:  Past Surgical History:  Procedure Laterality Date  . APPENDECTOMY    . COLPOSCOPY  2003  . COMBINED HYSTEROSCOPY DIAGNOSTIC / D&C  08/11/2012  . HYSTEROSCOPY W/D&C  04/07/2018   Procedure: DILATATION AND CURETTAGE /HYSTEROSCOPY;  Surgeon: Malachy Mood, MD;  Location: [facility];  Service: Gynecology;;  . IUD REMOVAL N/A 04/07/2018   Procedure: INTRAUTERINE DEVICE (IUD) REMOVAL;  Surgeon: Malachy Mood, MD;  Location: [facility];  Service: Gynecology;  Laterality: N/A;  . LAPAROSCOPIC UNILATERAL SALPINGECTOMY Left 04/07/2018   Procedure: LAPAROSCOPIC LEFT SALPINGECTOMY;  Surgeon: Malachy Mood, MD;  Location: [facility];  Service: Gynecology;  Laterality: Left;  . LAPAROSCOPY  08/11/2012, 08/2012   Right salpingostomy Ectopic pregnancy by K[place], 08/2012 Right Salpingectomy remaining ectopic  . MELANOMA EXCISION    . SALPINGECTOMY  08/2012    Gynecologic History:  Patient's last menstrual period was 02/12/2019 (approximate). Contraception: bilateral salpingectomy Last Pap: Results were: 2017- NIL   Obstetric History: G2P1011  Family History:  History reviewed. No pertinent family history.  Social History:  Social History   Socioeconomic History  . Marital status: Single    Spouse name: Not on file  . Number of children: Not on file  . Years of education: Not on file  . Highest education level: Not on file  Occupational History  . Not on file  Social Needs  . Financial resource strain:  Not on file  . Food insecurity:    Worry: Not on file    Inability: Not on file  . Transportation needs:    Medical: Not on file    Non-medical: Not on file  Tobacco Use  . Smoking status: Current Every Day Smoker    Packs/day: 0.25    Years: 17.00     Pack years: 4.25    Types: Cigarettes  . Smokeless tobacco: Never Used  Substance and Sexual Activity  . Alcohol use: Yes    Comment: Socially  . Drug use: No  . Sexual activity: Yes    Partners: Male    Birth control/protection: Surgical    Comment: Salpingectomy   Lifestyle  . Physical activity:    Days per week: 0 days    Minutes per session: 0 min  . Stress: To some extent  Relationships  . Social connections:    Talks on phone: More than three times a week    Gets together: Once a week    Attends religious service: 1 to 4 times per year    Active member of club or organization: No    Attends meetings of clubs or organizations: Never    Relationship status: Living with partner  . Intimate partner violence:    Fear of current or ex partner: No    Emotionally abused: No    Physically abused: No    Forced sexual activity: No  Other Topics Concern  . Not on file  Social History Narrative  . Not on file    Allergies:  No Known Allergies  Medications: Prior to Admission medications   Medication Sig Start Date End Date Taking? Authorizing Provider  ALPRAZolam (XANAX) 0.25 MG tablet Take 1 tablet (0.25 mg total) by mouth at bedtime as needed for anxiety. 01/13/19  Yes Boscia, Preslyn E, NP  busPIRone (BUSPAR) 5 MG tablet Take 1 tablet (5 mg total) by mouth 2 (two) times daily as needed. 01/13/19  Yes Boscia, Greer Ee, NP  ibuprofen (ADVIL,MOTRIN) 600 MG tablet Take 1 tablet (600 mg total) by mouth every 6 (six) hours as needed. 04/07/18  Yes Malachy Mood, MD  meloxicam (MOBIC) 15 MG tablet Mobic 15 mg tablet  Take 1 tablet every day by oral route.   Yes [provider]  traMADol (ULTRAM) 50 MG tablet tramadol 50 mg tablet  Take 1 tablet every 6 hours by oral route as needed.   Yes [provider]  fluconazole (DIFLUCAN) 150 MG tablet Take 1 tablet (150 mg total) by mouth once for 1 dose. Can take additional dose three days later if symptoms persist  03/10/19 03/10/19  Kioni Stahl R, MD  TAYTULLA 1-20 MG-MCG(24) CAPS Take 1 tablet by mouth daily for 28 days. 03/10/19 04/07/19  Homero Fellers, MD    Physical Exam Vitals: Blood pressure 110/70, pulse 87, height 5\' 4"  (1.626 m), weight 143 lb (64.9 kg), last menstrual period 02/12/2019.  General: NAD HEENT: normocephalic, anicteric Thyroid: no enlargement, no palpable nodules Pulmonary: No increased work of breathing, CTAB Cardiovascular: RRR, distal pulses 2+ Breast: Breast symmetrical, no tenderness, no palpable nodules or masses, no skin or nipple retraction present, no nipple discharge.  No axillary or supraclavicular lymphadenopathy. Abdomen: NABS, soft, non-tender, non-distended.  Umbilicus without lesions.  No hepatomegaly, splenomegaly or masses palpable. No evidence of hernia  Genitourinary:  External: Normal external female genitalia.  Normal urethral meatus, normal Bartholin's and Skene's glands.    Vagina: Normal vaginal mucosa, no  evidence of prolapse.    Cervix: Grossly normal in appearance, no bleeding  Uterus: Non-enlarged, mobile, normal contour.  No CMT  Adnexa: ovaries non-enlarged, no adnexal masses  Rectal: deferred  Lymphatic: no evidence of inguinal lymphadenopathy Extremities: no edema, erythema, or tenderness Neurologic: Grossly intact Psychiatric: mood appropriate, affect full  Female chaperone present for pelvic and breast  portions of the physical exam    Assessment: 33 y.o. G2P1011 routine annual exam  Plan: Problem List Items Addressed This Visit    None    Visit Diagnoses    Healthcare maintenance    -  Primary   Encounter for cervical Pap smear with pelvic exam       Yeast vaginitis       Relevant Medications   fluconazole (DIFLUCAN) 150 MG tablet   Encounter for initial prescription of contraceptive pills       Relevant Medications   TAYTULLA 1-20 MG-MCG(24) CAPS   Menorrhagia with regular cycle       Relevant Medications    TAYTULLA 1-20 MG-MCG(24) CAPS      2) STI screening  was offered and accepted  2)  ASCCP guidelines and rational discussed.  Patient opts for every 5 years screening interval  3) Contraception - the patient is currently using  tubal ligation.  She is interested in starting Contraception: to help manage menorrhagia- given rx for taytulla and 2 months of a sample pack  4) Routine healthcare maintenance including cholesterol, diabetes screening discussed managed by PCP  5) Return in about 3 days (around 03/13/2019) for lab visit.   Adrian Prows MD [facility] 03/10/2019 3:54 PM

## 2019-03-14 ENCOUNTER — Other Ambulatory Visit: Payer: Self-pay | Admitting: Obstetrics and Gynecology

## 2019-03-14 DIAGNOSIS — N760 Acute vaginitis: Principal | ICD-10-CM

## 2019-03-14 DIAGNOSIS — B9689 Other specified bacterial agents as the cause of diseases classified elsewhere: Secondary | ICD-10-CM

## 2019-03-14 LAB — NUSWAB VAGINITIS PLUS (VG+)
Atopobium vaginae: HIGH Score — AB
BVAB 2: HIGH Score — AB
Candida albicans, NAA: NEGATIVE
Candida glabrata, NAA: NEGATIVE
Chlamydia trachomatis, NAA: NEGATIVE
Megasphaera 1: HIGH Score — AB
Neisseria gonorrhoeae, NAA: NEGATIVE
Trich vag by NAA: NEGATIVE

## 2019-03-14 MED ORDER — METRONIDAZOLE 500 MG PO TABS: 500.0000 mg | ORAL_TABLET | Freq: Two times a day (BID) | ORAL | 0 refills | Status: AC

## 2019-03-14 NOTE — Progress Notes (Signed)
Released to mychart

## 2019-03-15 LAB — CYTOLOGY - PAP
Diagnosis: NEGATIVE
HPV (WINDOPATH): NOT DETECTED

## 2019-03-15 NOTE — Progress Notes (Signed)
NIL, released to mychart

## 2019-05-19 ENCOUNTER — Other Ambulatory Visit: Payer: Self-pay

## 2019-05-19 ENCOUNTER — Encounter: Payer: Self-pay | Admitting: Nurse Practitioner

## 2019-05-19 ENCOUNTER — Ambulatory Visit: Payer: 59 | Admitting: Nurse Practitioner

## 2019-05-19 VITALS — BP 114/73 | HR 76 | Resp 16 | Ht 64.0 in | Wt 147.8 lb

## 2019-05-19 DIAGNOSIS — R5383 Other fatigue: Secondary | ICD-10-CM | POA: Diagnosis not present

## 2019-05-19 DIAGNOSIS — F988 Other specified behavioral and emotional disorders with onset usually occurring in childhood and adolescence: Secondary | ICD-10-CM

## 2019-05-19 MED ORDER — AMPHETAMINE-DEXTROAMPHETAMINE 10 MG PO TABS: ORAL_TABLET | ORAL | 0 refills | Status: DC

## 2019-05-19 NOTE — Progress Notes (Signed)
[facility] [place], [place] [address]  Internal MEDICINE  Office Visit Note  Patient Name: Tracey Watson  [id number]  [id number]  Date of Service: 05/30/2019  Chief Complaint  Patient presents with  . Medical Management of Chronic Issues    discuss underlying health issues discussed at last visit, pt wants top discuss medication that she was put on at last visit    The patient is here for follow up visit. She states that over many years, she has had trouble with severe anxiety, depression, and attention deficit. These were never diagnosed as a child and she suffered with these processes until recent years. We have tried a few different antidepressants and every tried low dose of alprazolam. These medications have all just made her feel very sleepy and a bit numb. She states that she feels most anxious when she can't concentrate or when she has many things going on at the same time. Makes it difficult for her to prioritize. To deal with this, she has often made herself throw up. She denies feeling fat or ugly. States that this is how she has felt as dealt with things when she feels like there is too much going on and feels out of control. She was recently caught doing this by her boyfriend and he insisted she talk to her doctor about this.       Current Medication: Outpatient Encounter Medications as of 05/19/2019  Medication Sig  . ALPRAZolam (XANAX) 0.25 MG tablet Take 1 tablet (0.25 mg total) by mouth at bedtime as needed for anxiety.  Marland Kitchen ibuprofen (ADVIL,MOTRIN) 600 MG tablet Take 1 tablet (600 mg total) by mouth every 6 (six) hours as needed.  . meloxicam (MOBIC) 15 MG tablet Mobic 15 mg tablet  Take 1 tablet every day by oral route.  . traMADol (ULTRAM) 50 MG tablet tramadol 50 mg tablet  Take 1 tablet every 6 hours by oral route as needed.  . [DISCONTINUED] busPIRone (BUSPAR) 5 MG tablet Take 1 tablet (5 mg total) by mouth 2 (two) times daily as needed.   Marland Kitchen amphetamine-dextroamphetamine (ADDERALL) 10 MG tablet Take 1/2 to 1 tablet po BID prn focus/concentration  . TAYTULLA 1-20 MG-MCG(24) CAPS Take 1 tablet by mouth daily for 28 days.   No facility-administered encounter medications on file as of 05/19/2019.     Surgical History: Past Surgical History:  Procedure Laterality Date  . APPENDECTOMY    . COLPOSCOPY  2003  . COMBINED HYSTEROSCOPY DIAGNOSTIC / D&C  08/11/2012  . HYSTEROSCOPY W/D&C  04/07/2018   Procedure: DILATATION AND CURETTAGE /HYSTEROSCOPY;  Surgeon: Malachy Mood, MD;  Location: [facility];  Service: Gynecology;;  . IUD REMOVAL N/A 04/07/2018   Procedure: INTRAUTERINE DEVICE (IUD) REMOVAL;  Surgeon: Malachy Mood, MD;  Location: [facility];  Service: Gynecology;  Laterality: N/A;  . LAPAROSCOPIC UNILATERAL SALPINGECTOMY Left 04/07/2018   Procedure: LAPAROSCOPIC LEFT SALPINGECTOMY;  Surgeon: Malachy Mood, MD;  Location: [facility] OR[facility];  Service: Gynecology;  Laterality: Left;  . LAPAROSCOPY  08/11/2012, 08/2012   Right salpingostomy Ectopic pregnancy by Klett, 08/2012 Right Salpingectomy remaining ectopic  . MELANOMA EXCISION    . SALPINGECTOMY  08/2012    Medical History: Past Medical History:  Diagnosis Date  . Anxiety   . Benign carcinoid tumor of appendix   . 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 Ectopic pregnancy 08/11/2012  . Headache    migraines  . History of kidney stones    h/o  .  Hot flashes   . Irregular heart beat     Family History: History reviewed. No pertinent family history.  Social History   Socioeconomic History  . Marital status: Single    Spouse name: Not on file  . Number of children: Not on file  . Years of education: Not on file  . Highest education level: Not on file  Occupational History  . Not on file  Social Needs  . Financial resource strain: Not on file  . Food insecurity:    Worry: Not on file    Inability: Not on file  . Transportation needs:    Medical: Not on file     Non-medical: Not on file  Tobacco Use  . Smoking status: Current Every Day Smoker    Packs/day: 0.25    Years: 17.00    Pack years: 4.25    Types: Cigarettes  . Smokeless tobacco: Never Used  Substance and Sexual Activity  . Alcohol use: Yes    Comment: Socially  . Drug use: No  . Sexual activity: Yes    Partners: Male    Birth control/protection: Surgical    Comment: Salpingectomy   Lifestyle  . Physical activity:    Days per week: 0 days    Minutes per session: 0 min  . Stress: To some extent  Relationships  . Social connections:    Talks on phone: More than three times a week    Gets together: Once a week    Attends religious service: 1 to 4 times per year    Active member of club or organization: No    Attends meetings of clubs or organizations: Never    Relationship status: Living with partner  . Intimate partner violence:    Fear of current or ex partner: No    Emotionally abused: No    Physically abused: No    Forced sexual activity: No  Other Topics Concern  . Not on file  Social History Narrative  . Not on file      Review of Systems  Constitutional: Positive for fatigue. Negative for chills and unexpected weight change.  HENT: Negative for congestion, postnasal drip, rhinorrhea, sneezing and sore throat.   Respiratory: Negative for cough, chest tightness and shortness of breath.   Cardiovascular: Negative for chest pain and palpitations.  Gastrointestinal: Negative for abdominal pain, constipation, diarrhea, nausea and vomiting.  Endocrine: Negative for cold intolerance, heat intolerance, polydipsia and polyuria.  Musculoskeletal: Negative for arthralgias, back pain, joint swelling and neck pain.  Skin: Negative for rash.  Allergic/Immunologic: Negative for environmental allergies.  Neurological: Negative for dizziness, tremors, numbness and headaches.  Hematological: Negative for adenopathy. Does not bruise/bleed easily.  Psychiatric/Behavioral:  Positive for behavioral problems (Depression), decreased concentration, dysphoric mood and sleep disturbance. Negative for suicidal ideas. The patient is nervous/anxious.     Today's Vitals   05/19/19 1441  BP: 114/73  Pulse: 76  Resp: 16  SpO2: 100%  Weight: 147 lb 12.8 oz (67 kg)  Height: 5\' 4"  (1.626 m)   Body mass index is 25.37 kg/m.  Physical Exam Vitals signs and nursing note reviewed.  Constitutional:      General: She is not in acute distress.    Appearance: Normal appearance. She is well-developed. She is not diaphoretic.  HENT:     Head: Normocephalic and atraumatic.     Mouth/Throat:     Pharynx: No oropharyngeal exudate.  Eyes:     Conjunctiva/sclera: Conjunctivae normal.  Pupils: Pupils are equal, round, and reactive to light.  Neck:     Musculoskeletal: Normal range of motion and neck supple.     Thyroid: No thyromegaly.     Vascular: No JVD.     Trachea: No tracheal deviation.  Cardiovascular:     Rate and Rhythm: Normal rate and regular rhythm.     Heart sounds: Normal heart sounds. No murmur. No friction rub. No gallop.   Pulmonary:     Effort: Pulmonary effort is normal. No respiratory distress.     Breath sounds: Normal breath sounds. No wheezing or rales.  Chest:     Chest wall: No tenderness.  Abdominal:     General: Bowel sounds are normal.     Palpations: Abdomen is soft.  Musculoskeletal: Normal range of motion.  Lymphadenopathy:     Cervical: No cervical adenopathy.  Skin:    General: Skin is warm and dry.  Neurological:     General: No focal deficit present.     Mental Status: She is alert and oriented to person, place, and time.     Cranial Nerves: No cranial nerve deficit.  Psychiatric:        Attention and Perception: Attention and perception normal.        Mood and Affect: Mood is anxious. Affect is tearful.        Speech: Speech normal.        Behavior: Behavior normal. Behavior is cooperative.        Thought Content: Thought  content normal.        Cognition and Memory: Cognition and memory normal.        Judgment: Judgment normal.     Comments: Administered an ADD self test for which she scored high likelihood of adult onset ADD    Assessment/Plan: 1. Other fatigue Likely from increased amounts of stress. Will monitor.   2. Attention deficit disorder (ADD) in adult Severe and likely leading to increased anxiety and compulsive behaviors. Will do trial of adderall 10mg . May take 1/2 to 1 tablet twice daily as needed for focus and concentration.  - amphetamine-dextroamphetamine (ADDERALL) 10 MG tablet; Take 1/2 to 1 tablet po BID prn focus/concentration  Dispense: 60 tablet; Refill: 0  General Counseling: Araly verbalizes understanding of the findings of todays visit and agrees with plan of treatment. I have discussed any further diagnostic evaluation that may be needed or ordered today. We also reviewed her medications today. she has been encouraged to call the office with any questions or concerns that should arise related to todays visit.  This patient was seen by [place] with Dr Lavera Guise as a part of collaborative care agreement  Meds ordered this encounter  Medications  . amphetamine-dextroamphetamine (ADDERALL) 10 MG tablet    Sig: Take 1/2 to 1 tablet po BID prn focus/concentration    Dispense:  60 tablet    Refill:  0    Order Specific Question:   Supervising Provider    Answer:   Lavera Guise [[id number]]    Time spent: 82 Minutes      Dr Lavera Guise Internal medicine

## 2019-05-30 DIAGNOSIS — F988 Other specified behavioral and emotional disorders with onset usually occurring in childhood and adolescence: Secondary | ICD-10-CM | POA: Insufficient documentation

## 2019-05-30 DIAGNOSIS — F900 Attention-deficit hyperactivity disorder, predominantly inattentive type: Secondary | ICD-10-CM | POA: Insufficient documentation

## 2019-06-06 ENCOUNTER — Other Ambulatory Visit: Payer: Self-pay

## 2019-06-09 ENCOUNTER — Other Ambulatory Visit: Payer: Self-pay | Admitting: Obstetrics and Gynecology

## 2019-06-09 DIAGNOSIS — B9689 Other specified bacterial agents as the cause of diseases classified elsewhere: Secondary | ICD-10-CM

## 2019-06-09 DIAGNOSIS — N760 Acute vaginitis: Secondary | ICD-10-CM

## 2019-06-09 MED ORDER — METRONIDAZOLE 500 MG PO TABS: 500.0000 mg | ORAL_TABLET | Freq: Two times a day (BID) | ORAL | 1 refills | Status: AC

## 2019-06-20 ENCOUNTER — Other Ambulatory Visit: Payer: Self-pay

## 2019-06-20 ENCOUNTER — Encounter: Payer: Self-pay | Admitting: Nurse Practitioner

## 2019-06-20 ENCOUNTER — Ambulatory Visit: Payer: 59 | Admitting: Nurse Practitioner

## 2019-06-20 VITALS — BP 111/73 | HR 86 | Temp 98.7°F | Resp 16 | Ht 64.0 in | Wt 143.2 lb

## 2019-06-20 DIAGNOSIS — R5383 Other fatigue: Secondary | ICD-10-CM | POA: Diagnosis not present

## 2019-06-20 DIAGNOSIS — F988 Other specified behavioral and emotional disorders with onset usually occurring in childhood and adolescence: Secondary | ICD-10-CM

## 2019-06-20 DIAGNOSIS — F411 Generalized anxiety disorder: Secondary | ICD-10-CM | POA: Diagnosis not present

## 2019-06-20 MED ORDER — AMPHETAMINE-DEXTROAMPHETAMINE 20 MG PO TABS: 20.0000 mg | ORAL_TABLET | Freq: Two times a day (BID) | ORAL | 0 refills | Status: DC

## 2019-06-20 NOTE — Progress Notes (Signed)
[facility] [place], [place] [address]  Internal MEDICINE  Office Visit Note  Patient Name: Tracey Watson  [id number]  [id number]  Date of Service: 06/24/2019  Chief Complaint  Patient presents with  . Medical Management of Chronic Issues    4wk follow up, started adderall on low dose    The patient is here for follow up visit. She states that over many years, she has had trouble with severe anxiety, depression, and attention deficit. These were never diagnosed as a child and she suffered with these processes until recent years. We have tried a few different antidepressants and every tried low dose of alprazolam. These medications have all just made her feel very sleepy and a bit numb. She states that she feels most anxious when she can't concentrate or when she has many things going on at the same time. Makes it difficult for her to prioritize. To deal with this, she has often made herself throw up. She denies feeling fat or ugly. States that this is how she has felt as dealt with things when she feels like there is too much going on and feels out of control. At her last visit, she did a self-assessment for ADD and scored very high in many categories concerning for adult ADD. I started her on dose of Adderall 10mg . She states that starting this medication has helped her in so many ways. She is able to focus and finish tasks at work. She can concentrate and get things done. She is not feeling out of control like she was at her last visit. She has not had too many negative side effects. States that if she takes this medication too lat in the day, she does have trouble sleeping. She is currently taking two tablets in the morning. She sometimes takes 1/2 tablet in the early afternoon.       Current Medication: Outpatient Encounter Medications as of 06/20/2019  Medication Sig  . ALPRAZolam (XANAX) 0.25 MG tablet Take 1 tablet (0.25 mg total) by mouth at bedtime as needed  for anxiety.  Marland Kitchen ibuprofen (ADVIL,MOTRIN) 600 MG tablet Take 1 tablet (600 mg total) by mouth every 6 (six) hours as needed.  . meloxicam (MOBIC) 15 MG tablet Mobic 15 mg tablet  Take 1 tablet every day by oral route.  . traMADol (ULTRAM) 50 MG tablet tramadol 50 mg tablet  Take 1 tablet every 6 hours by oral route as needed.  . [DISCONTINUED] amphetamine-dextroamphetamine (ADDERALL) 10 MG tablet Take 1/2 to 1 tablet po BID prn focus/concentration  . amphetamine-dextroamphetamine (ADDERALL) 20 MG tablet Take 1 tablet (20 mg total) by mouth 2 (two) times daily. Fill after 07/18/2019  . TAYTULLA 1-20 MG-MCG(24) CAPS Take 1 tablet by mouth daily for 28 days.  . [DISCONTINUED] amphetamine-dextroamphetamine (ADDERALL) 20 MG tablet Take 1 tablet (20 mg total) by mouth 2 (two) times daily.   No facility-administered encounter medications on file as of 06/20/2019.     Surgical History: Past Surgical History:  Procedure Laterality Date  . APPENDECTOMY    . COLPOSCOPY  2003  . COMBINED HYSTEROSCOPY DIAGNOSTIC / D&C  08/11/2012  . HYSTEROSCOPY W/D&C  04/07/2018   Procedure: DILATATION AND CURETTAGE /HYSTEROSCOPY;  Surgeon: Malachy Mood, MD;  Location: [facility];  Service: Gynecology;;  . IUD REMOVAL N/A 04/07/2018   Procedure: INTRAUTERINE DEVICE (IUD) REMOVAL;  Surgeon: Malachy Mood, MD;  Location: [facility];  Service: Gynecology;  Laterality: N/A;  . LAPAROSCOPIC UNILATERAL SALPINGECTOMY Left 04/07/2018  Procedure: LAPAROSCOPIC LEFT SALPINGECTOMY;  Surgeon: Malachy Mood, MD;  Location: [facility];  Service: Gynecology;  Laterality: Left;  . LAPAROSCOPY  08/11/2012, 08/2012   Right salpingostomy Ectopic pregnancy by [facility]lett, 08/2012 Right Salpingectomy remaining ectopic  . MELANOMA EXCISION    . SALPINGECTOMY  08/2012    Medical History: Past Medical History:  Diagnosis Date  . Anxiety   . Benign carcinoid tumor of appendix   . 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 Ectopic pregnancy  08/11/2012  . Headache    migraines  . History of kidney stones    h/o  . Hot flashes   . Irregular heart beat     Family History: History reviewed. No pertinent family history.  Social History   Socioeconomic History  . Marital status: Single    Spouse name: Not on file  . Number of children: Not on file  . Years of education: Not on file  . Highest education level: Not on file  Occupational History  . Not on file  Social Needs  . Financial resource strain: Not on file  . Food insecurity    Worry: Not on file    Inability: Not on file  . Transportation needs    Medical: Not on file    Non-medical: Not on file  Tobacco Use  . Smoking status: Current Every Day Smoker    Packs/day: 0.25    Years: 17.00    Pack years: 4.25    Types: Cigarettes  . Smokeless tobacco: Never Used  Substance and Sexual Activity  . Alcohol use: Yes    Comment: Socially  . Drug use: No  . Sexual activity: Yes    Partners: Male    Birth control/protection: Surgical    Comment: Salpingectomy   Lifestyle  . Physical activity    Days per week: 0 days    Minutes per session: 0 min  . Stress: To some extent  Relationships  . Social connections    Talks on phone: More than three times a week    Gets together: Once a week    Attends religious service: 1 to 4 times per year    Active member of club or organization: No    Attends meetings of clubs or organizations: Never    Relationship status: Living with partner  . Intimate partner violence    Fear of current or ex partner: No    Emotionally abused: No    Physically abused: No    Forced sexual activity: No  Other Topics Concern  . Not on file  Social History Narrative  . Not on file      Review of Systems  Constitutional: Negative for chills, fatigue and unexpected weight change.  HENT: Negative for congestion, postnasal drip, rhinorrhea, sneezing and sore throat.   Respiratory: Negative for cough, chest tightness and shortness  of breath.   Cardiovascular: Negative for chest pain and palpitations.  Gastrointestinal: Negative for abdominal pain, constipation, diarrhea, nausea and vomiting.  Endocrine: Negative for cold intolerance, heat intolerance, polydipsia and polyuria.  Musculoskeletal: Negative for arthralgias, back pain, joint swelling and neck pain.  Skin: Negative for rash.  Allergic/Immunologic: Negative for environmental allergies.  Neurological: Negative for dizziness, tremors, numbness and headaches.  Hematological: Negative for adenopathy. Does not bruise/bleed easily.  Psychiatric/Behavioral: Positive for behavioral problems (Depression) and decreased concentration. Negative for dysphoric mood, sleep disturbance and suicidal ideas. The patient is nervous/anxious.        Improved since her most recent  visit.     Today's Vitals   06/20/19 1540  BP: 111/73  Pulse: 86  Resp: 16  Temp: 98.7 F (37.1 C)  SpO2: 100%  Weight: 143 lb 3.2 oz (65 kg)  Height: 5\' 4"  (1.626 m)   Body mass index is 24.58 kg/m.  Physical Exam Vitals signs and nursing note reviewed.  Constitutional:      General: She is not in acute distress.    Appearance: Normal appearance. She is well-developed. She is not diaphoretic.  HENT:     Head: Normocephalic and atraumatic.     Mouth/Throat:     Pharynx: No oropharyngeal exudate.  Eyes:     Conjunctiva/sclera: Conjunctivae normal.     Pupils: Pupils are equal, round, and reactive to light.  Neck:     Musculoskeletal: Normal range of motion and neck supple.     Thyroid: No thyromegaly.     Vascular: No JVD.     Trachea: No tracheal deviation.  Cardiovascular:     Rate and Rhythm: Normal rate and regular rhythm.     Heart sounds: Normal heart sounds. No murmur. No friction rub. No gallop.   Pulmonary:     Effort: Pulmonary effort is normal. No respiratory distress.     Breath sounds: Normal breath sounds. No wheezing or rales.  Chest:     Chest wall: No tenderness.   Abdominal:     General: Bowel sounds are normal.     Palpations: Abdomen is soft.  Musculoskeletal: Normal range of motion.  Lymphadenopathy:     Cervical: No cervical adenopathy.  Skin:    General: Skin is warm and dry.  Neurological:     General: No focal deficit present.     Mental Status: She is alert and oriented to person, place, and time.     Cranial Nerves: No cranial nerve deficit.  Psychiatric:        Attention and Perception: Attention and perception normal.        Mood and Affect: Mood is anxious. Affect is tearful.        Speech: Speech normal.        Behavior: Behavior normal. Behavior is cooperative.        Thought Content: Thought content normal.        Cognition and Memory: Cognition and memory normal.        Judgment: Judgment normal.     Comments: Patient's symptoms and mood have improved since her last visit.    Assessment/Plan:  1. Other fatigue Improved since last visit. Continue to monitor.   2. Attention deficit disorder (ADD) in adult much improved since her last visit. Increase sadderall to 20mg  twice daily as needed for focus/concentration. Two thirty day prescriptions provided today. Dates are 06/20/2019 and 07/18/2019 - amphetamine-dextroamphetamine (ADDERALL) 20 MG tablet; Take 1 tablet (20 mg total) by mouth 2 (two) times daily. Fill after 07/18/2019  Dispense: 60 tablet; Refill: 0  3. Generalized anxiety disorder Improved anxiety since starting adderall. Will continue to monitor.   General Counseling: Anberlin verbalizes understanding of the findings of todays visit and agrees with plan of treatment. I have discussed any further diagnostic evaluation that may be needed or ordered today. We also reviewed her medications today. she has been encouraged to call the office with any questions or concerns that should arise related to todays visit.  This patient was seen by Leretha Pol FNP Collaboration with Dr Lavera Guise as a part of collaborative care  agreement  Meds ordered this encounter  Medications  . DISCONTD: amphetamine-dextroamphetamine (ADDERALL) 20 MG tablet    Sig: Take 1 tablet (20 mg total) by mouth 2 (two) times daily.    Dispense:  60 tablet    Refill:  0    Order Specific Question:   Supervising Provider    Answer:   Lavera Guise [[id number]]  . amphetamine-dextroamphetamine (ADDERALL) 20 MG tablet    Sig: Take 1 tablet (20 mg total) by mouth 2 (two) times daily. Fill after 07/18/2019    Dispense:  60 tablet    Refill:  0    Order Specific Question:   Supervising Provider    Answer:   Lavera Guise [[id number]]    Time spent: 31 Minutes      Dr Lavera Guise Internal medicine

## 2019-07-18 ENCOUNTER — Other Ambulatory Visit: Payer: Self-pay | Admitting: Nurse Practitioner

## 2019-07-18 DIAGNOSIS — F988 Other specified behavioral and emotional disorders with onset usually occurring in childhood and adolescence: Secondary | ICD-10-CM

## 2019-08-18 ENCOUNTER — Encounter: Payer: Self-pay | Admitting: Nurse Practitioner

## 2019-08-18 ENCOUNTER — Ambulatory Visit: Payer: 59 | Admitting: Nurse Practitioner

## 2019-08-18 VITALS — BP 115/79 | HR 79 | Resp 16 | Ht 64.0 in | Wt 131.0 lb

## 2019-08-18 DIAGNOSIS — Z79899 Other long term (current) drug therapy: Secondary | ICD-10-CM | POA: Diagnosis not present

## 2019-08-18 DIAGNOSIS — G4489 Other headache syndrome: Secondary | ICD-10-CM | POA: Diagnosis not present

## 2019-08-18 DIAGNOSIS — F988 Other specified behavioral and emotional disorders with onset usually occurring in childhood and adolescence: Secondary | ICD-10-CM | POA: Diagnosis not present

## 2019-08-18 DIAGNOSIS — F411 Generalized anxiety disorder: Secondary | ICD-10-CM

## 2019-08-18 LAB — POCT URINE DRUG SCREEN
POC Amphetamine UR: POSITIVE — AB
POC BENZODIAZEPINES UR: NOT DETECTED
POC Barbiturate UR: NOT DETECTED
POC Cocaine UR: NOT DETECTED
POC Ecstasy UR: NOT DETECTED
POC Marijuana UR: NOT DETECTED
POC Methadone UR: NOT DETECTED
POC Methamphetamine UR: NOT DETECTED
POC Opiate Ur: POSITIVE — AB
POC Oxycodone UR: NOT DETECTED
POC PHENCYCLIDINE UR: NOT DETECTED
POC TRICYCLICS UR: NOT DETECTED

## 2019-08-18 MED ORDER — AMPHETAMINE-DEXTROAMPHETAMINE 20 MG PO TABS: 20.0000 mg | ORAL_TABLET | Freq: Two times a day (BID) | ORAL | 0 refills | Status: DC

## 2019-08-18 MED ORDER — IBUPROFEN 800 MG PO TABS: 800.0000 mg | ORAL_TABLET | Freq: Four times a day (QID) | ORAL | 1 refills | Status: DC | PRN

## 2019-08-18 NOTE — Progress Notes (Signed)
[facility] [place],  [address]  Internal MEDICINE  Office Visit Note  Patient Name: Tracey Watson  [id number]  [id number]  Date of Service: 08/18/2019  No chief complaint on file.   The patient is here for follow up visit. She states that over many years, she has had trouble with severe anxiety, depression, and attention deficit. These were never diagnosed as a child and she suffered with these processes until recent years. We have tried a few different antidepressants and every tried low dose of alprazolam. These medications have all just made her feel very sleepy and a bit numb. She states that she feels most anxious when she can't concentrate or when she has many things going on at the same time. Makes it difficult for her to prioritize. To deal with this, she has often made herself throw up. She denies feeling fat or ugly. States that this is how she has felt as dealt with things when she feels like there is too much going on and feels out of control. At a recent visit, she did a self-assessment for ADD and scored very high in many categories concerning for adult ADD. Increased dose of Adderall to 20mg  twice daily as needed. She states that the dose of this medication has helped her in so many ways. She is able to focus and finish tasks at work. She can concentrate and get things done. She is not feeling out of control despite recent increase in stress in her life. She states that she does have intermittent migraine headaches and menstrual cramps monthly. She used to take ibuprofen when needed which really helped her a lot. She would like to have new prescription for this.       Current Medication: Outpatient Encounter Medications as of 08/18/2019  Medication Sig  . ALPRAZolam (XANAX) 0.25 MG tablet Take 1 tablet (0.25 mg total) by mouth at bedtime as needed for anxiety.  Marland Kitchen amphetamine-dextroamphetamine (ADDERALL) 20 MG tablet Take 1 tablet (20 mg total)  by mouth 2 (two) times daily.  Marland Kitchen ibuprofen (ADVIL) 800 MG tablet Take 1 tablet (800 mg total) by mouth every 6 (six) hours as needed.  . [DISCONTINUED] amphetamine-dextroamphetamine (ADDERALL) 20 MG tablet Take 1 tablet (20 mg total) by mouth 2 (two) times daily. Fill after 07/18/2019  . [DISCONTINUED] amphetamine-dextroamphetamine (ADDERALL) 20 MG tablet Take 1 tablet (20 mg total) by mouth 2 (two) times daily.  . [DISCONTINUED] amphetamine-dextroamphetamine (ADDERALL) 20 MG tablet Take 1 tablet (20 mg total) by mouth 2 (two) times daily.  . [DISCONTINUED] ibuprofen (ADVIL,MOTRIN) 600 MG tablet Take 1 tablet (600 mg total) by mouth every 6 (six) hours as needed.  . [DISCONTINUED] meloxicam (MOBIC) 15 MG tablet Mobic 15 mg tablet  Take 1 tablet every day by oral route.  . [DISCONTINUED] TAYTULLA 1-20 MG-MCG(24) CAPS Take 1 tablet by mouth daily for 28 days.  . [DISCONTINUED] traMADol (ULTRAM) 50 MG tablet tramadol 50 mg tablet  Take 1 tablet every 6 hours by oral route as needed.   No facility-administered encounter medications on file as of 08/18/2019.     Surgical History: Past Surgical History:  Procedure Laterality Date  . APPENDECTOMY    . COLPOSCOPY  2003  . COMBINED HYSTEROSCOPY DIAGNOSTIC / D&C  08/11/2012  . HYSTEROSCOPY W/D&C  04/07/2018   Procedure: DILATATION AND CURETTAGE /HYSTEROSCOPY;  Surgeon: Malachy Mood, MD;  Location: [facility];  Service: Gynecology;;  . IUD REMOVAL N/A 04/07/2018   Procedure: INTRAUTERINE DEVICE (  IUD) REMOVAL;  Surgeon: Malachy Mood, MD;  Location: [facility];  Service: Gynecology;  Laterality: N/A;  . LAPAROSCOPIC UNILATERAL SALPINGECTOMY Left 04/07/2018   Procedure: LAPAROSCOPIC LEFT SALPINGECTOMY;  Surgeon: Malachy Mood, MD;  Location: [facility];  Service: Gynecology;  Laterality: Left;  . LAPAROSCOPY  08/11/2012, 08/2012   Right salpingostomy Ectopic pregnancy by Klett, 08/2012 Right Salpingectomy remaining ectopic  . MELANOMA EXCISION    .  SALPINGECTOMY  08/2012    Medical History: Past Medical History:  Diagnosis Date  . Anxiety   . Benign carcinoid tumor of appendix   . 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 Ectopic pregnancy 08/11/2012  . Headache    migraines  . History of kidney stones    h/o  . Hot flashes   . Irregular heart beat     Family History: History reviewed. No pertinent family history.  Social History   Socioeconomic History  . Marital status: Single    Spouse name: Not on file  . Number of children: Not on file  . Years of education: Not on file  . Highest education level: Not on file  Occupational History  . Not on file  Social Needs  . Financial resource strain: Not on file  . Food insecurity    Worry: Not on file    Inability: Not on file  . Transportation needs    Medical: Not on file    Non-medical: Not on file  Tobacco Use  . Smoking status: Current Every Day Smoker    Packs/day: 0.25    Years: 17.00    Pack years: 4.25    Types: Cigarettes  . Smokeless tobacco: Never Used  Substance and Sexual Activity  . Alcohol use: Yes    Comment: Socially  . Drug use: No  . Sexual activity: Yes    Partners: Male    Birth control/protection: Surgical    Comment: Salpingectomy   Lifestyle  . Physical activity    Days per week: 0 days    Minutes per session: 0 min  . Stress: To some extent  Relationships  . Social connections    Talks on phone: More than three times a week    Gets together: Once a week    Attends religious service: 1 to 4 times per year    Active member of club or organization: No    Attends meetings of clubs or organizations: Never    Relationship status: Living with partner  . Intimate partner violence    Fear of current or ex partner: No    Emotionally abused: No    Physically abused: No    Forced sexual activity: No  Other Topics Concern  . Not on file  Social History Narrative  . Not on file      Review of Systems  Constitutional: Negative  for chills, fatigue and unexpected weight change.  HENT: Negative for congestion, postnasal drip, rhinorrhea, sneezing and sore throat.   Respiratory: Negative for cough, chest tightness and shortness of breath.   Cardiovascular: Negative for chest pain and palpitations.  Gastrointestinal: Negative for abdominal pain, constipation, diarrhea, nausea and vomiting.  Endocrine: Negative for cold intolerance, heat intolerance, polydipsia and polyuria.  Genitourinary: Positive for menstrual problem.       Cramps during the times of her period.   Musculoskeletal: Negative for arthralgias, back pain, joint swelling and neck pain.  Skin: Negative for rash.  Allergic/Immunologic: Negative for environmental allergies.  Neurological: Positive for headaches. Negative for  dizziness, tremors and numbness.  Hematological: Negative for adenopathy. Does not bruise/bleed easily.  Psychiatric/Behavioral: Positive for behavioral problems (Depression) and decreased concentration. Negative for dysphoric mood, sleep disturbance and suicidal ideas. The patient is nervous/anxious.        Improved since her most recent visit.     Today's Vitals   08/18/19 1038  BP: 115/79  Pulse: 79  Resp: 16  SpO2: 100%  Weight: 131 lb (59.4 kg)  Height: 5\' 4"  (1.626 m)   Body mass index is 22.49 kg/m.  Physical Exam Vitals signs and nursing note reviewed.  Constitutional:      General: She is not in acute distress.    Appearance: Normal appearance. She is well-developed. She is not diaphoretic.  HENT:     Head: Normocephalic and atraumatic.     Mouth/Throat:     Pharynx: No oropharyngeal exudate.  Eyes:     Conjunctiva/sclera: Conjunctivae normal.     Pupils: Pupils are equal, round, and reactive to light.  Neck:     Musculoskeletal: Normal range of motion and neck supple.     Thyroid: No thyromegaly.     Vascular: No JVD.     Trachea: No tracheal deviation.  Cardiovascular:     Rate and Rhythm: Normal rate and  regular rhythm.     Heart sounds: Normal heart sounds. No murmur. No friction rub. No gallop.   Pulmonary:     Effort: Pulmonary effort is normal. No respiratory distress.     Breath sounds: Normal breath sounds. No wheezing or rales.  Chest:     Chest wall: No tenderness.  Abdominal:     General: Bowel sounds are normal.     Palpations: Abdomen is soft.  Musculoskeletal: Normal range of motion.  Lymphadenopathy:     Cervical: No cervical adenopathy.  Skin:    General: Skin is warm and dry.  Neurological:     General: No focal deficit present.     Mental Status: She is alert and oriented to person, place, and time.     Cranial Nerves: No cranial nerve deficit.  Psychiatric:        Attention and Perception: Attention and perception normal.        Mood and Affect: Mood is anxious. Affect is tearful.        Speech: Speech normal.        Behavior: Behavior normal. Behavior is cooperative.        Thought Content: Thought content normal.        Cognition and Memory: Cognition and memory normal.        Judgment: Judgment normal.     Comments: Patient's symptoms and mood have improved since her last visit.    Assessment/Plan: 1. Headache syndrome Add ibuprofen 800mg  up to three times daily if needed for pain/inflammation/headache.  - ibuprofen (ADVIL) 800 MG tablet; Take 1 tablet (800 mg total) by mouth every 6 (six) hours as needed.  Dispense: 90 tablet; Refill: 1  2. Attention deficit disorder (ADD) in adult Doing well. May continue adderall 20mg  twice daily as needed. Three 30 day prescriptions were sent to her pharmacy. Dates are 08/18/2019, 09/16/2019, and 10/14/2019 - amphetamine-dextroamphetamine (ADDERALL) 20 MG tablet; Take 1 tablet (20 mg total) by mouth 2 (two) times daily.  Dispense: 60 tablet; Refill: 0  3. Generalized anxiety disorder Refer for mental health counseling  - Ambulatory referral to Psychiatry  4. Encounter for long-term (current) use of medications - POCT  Urine Drug Screen  positive for AMP and also positive for opiates which were previously prescribed by orthopedic provider.   General Counseling: Soliyana verbalizes understanding of the findings of todays visit and agrees with plan of treatment. I have discussed any further diagnostic evaluation that may be needed or ordered today. We also reviewed her medications today. she has been encouraged to call the office with any questions or concerns that should arise related to todays visit.  Refilled Controlled medications today. Reviewed risks and possible side effects associated with taking Stimulants. Combination of these drugs with other psychotropic medications could cause dizziness and drowsiness. Pt needs to Monitor symptoms and exercise caution in driving and operating heavy machinery to avoid damages to oneself, to others and to the surroundings. Patient verbalized understanding in this matter. Dependence and abuse for these drugs will be monitored closely. A Controlled substance policy and procedure is on file which allows [facility] to order a urine drug screen test at any visit. Patient understands and agrees with the plan..  This patient was seen by Leretha Pol FNP Collaboration with Dr Lavera Guise as a part of collaborative care agreement  Orders Placed This Encounter  Procedures  . Ambulatory referral to Psychiatry  . POCT Urine Drug Screen    Meds ordered this encounter  Medications  . ibuprofen (ADVIL) 800 MG tablet    Sig: Take 1 tablet (800 mg total) by mouth every 6 (six) hours as needed.    Dispense:  90 tablet    Refill:  1    Order Specific Question:   Supervising Provider    Answer:   Lavera Guise [id number]  . DISCONTD: amphetamine-dextroamphetamine (ADDERALL) 20 MG tablet    Sig: Take 1 tablet (20 mg total) by mouth 2 (two) times daily.    Dispense:  60 tablet    Refill:  0    Order Specific Question:   Supervising Provider    Answer:   Lavera Guise [id number]   . DISCONTD: amphetamine-dextroamphetamine (ADDERALL) 20 MG tablet    Sig: Take 1 tablet (20 mg total) by mouth 2 (two) times daily.    Dispense:  60 tablet    Refill:  0    Fill after 09/16/2019    Order Specific Question:   Supervising Provider    Answer:   Lavera Guise [id number]  . amphetamine-dextroamphetamine (ADDERALL) 20 MG tablet    Sig: Take 1 tablet (20 mg total) by mouth 2 (two) times daily.    Dispense:  60 tablet    Refill:  0    Fill after 10/14/2019    Order Specific Question:   Supervising Provider    Answer:   Lavera Guise [id number]    Time spent: 25 Minutes      Dr Lavera Guise Internal medicine

## 2019-08-21 ENCOUNTER — Ambulatory Visit: Payer: 59 | Admitting: Nurse Practitioner

## 2019-09-13 ENCOUNTER — Ambulatory Visit: Payer: Self-pay | Admitting: Psychiatry

## 2019-09-15 ENCOUNTER — Encounter: Payer: Self-pay | Admitting: Nurse Practitioner

## 2019-09-15 ENCOUNTER — Other Ambulatory Visit: Payer: Self-pay | Admitting: Nurse Practitioner

## 2019-09-15 DIAGNOSIS — F988 Other specified behavioral and emotional disorders with onset usually occurring in childhood and adolescence: Secondary | ICD-10-CM

## 2019-09-15 MED ORDER — AMPHETAMINE-DEXTROAMPHETAMINE 20 MG PO TABS: 20.0000 mg | ORAL_TABLET | Freq: Two times a day (BID) | ORAL | 0 refills | Status: DC

## 2019-09-15 NOTE — Progress Notes (Signed)
Sent single prescription for Adderall 20mg  twice daily to patient's pharmacy. I believe they were missing second prescription to fill in September.

## 2019-10-05 ENCOUNTER — Ambulatory Visit: Payer: Self-pay | Admitting: Psychiatry

## 2019-10-10 ENCOUNTER — Emergency Department
Admission: EM | Admit: 2019-10-10 | Discharge: 2019-10-10 | Disposition: A | Discharge status: Home / Self Care | Payer: 59 | Attending: Emergency Medicine | Admitting: Emergency Medicine

## 2019-10-10 ENCOUNTER — Emergency Department: Payer: 59

## 2019-10-10 ENCOUNTER — Encounter: Payer: Self-pay | Admitting: Emergency Medicine

## 2019-10-10 ENCOUNTER — Other Ambulatory Visit: Payer: Self-pay

## 2019-10-10 DIAGNOSIS — F17210 Nicotine dependence, cigarettes, uncomplicated: Secondary | ICD-10-CM | POA: Insufficient documentation

## 2019-10-10 DIAGNOSIS — Z79899 Other long term (current) drug therapy: Secondary | ICD-10-CM | POA: Diagnosis not present

## 2019-10-10 DIAGNOSIS — G5791 Unspecified mononeuropathy of right lower limb: Secondary | ICD-10-CM | POA: Diagnosis not present

## 2019-10-10 DIAGNOSIS — Z975 Presence of (intrauterine) contraceptive device: Secondary | ICD-10-CM | POA: Insufficient documentation

## 2019-10-10 DIAGNOSIS — M79661 Pain in right lower leg: Secondary | ICD-10-CM | POA: Diagnosis present

## 2019-10-10 LAB — CBC WITH DIFFERENTIAL/PLATELET
Abs Immature Granulocytes: 0.02 10*3/uL (ref 0.00–0.07)
Basophils Absolute: 0 10*3/uL (ref 0.0–0.1)
Basophils Relative: 1 %
Eosinophils Absolute: 0.1 10*3/uL (ref 0.0–0.5)
Eosinophils Relative: 1 %
HCT: 46.1 % — ABNORMAL HIGH (ref 36.0–46.0)
Hemoglobin: 14.8 g/dL (ref 12.0–15.0)
Immature Granulocytes: 0 %
Lymphocytes Relative: 28 %
Lymphs Abs: 1.8 10*3/uL (ref 0.7–4.0)
MCH: 30.3 pg (ref 26.0–34.0)
MCHC: 32.1 g/dL (ref 30.0–36.0)
MCV: 94.5 fL (ref 80.0–100.0)
Monocytes Absolute: 0.5 10*3/uL (ref 0.1–1.0)
Monocytes Relative: 8 %
Neutro Abs: 4.1 10*3/uL (ref 1.7–7.7)
Neutrophils Relative %: 62 %
Platelets: 258 10*3/uL (ref 150–400)
RBC: 4.88 MIL/uL (ref 3.87–5.11)
RDW: 12.8 % (ref 11.5–15.5)
WBC: 6.5 10*3/uL (ref 4.0–10.5)
nRBC: 0 % (ref 0.0–0.2)

## 2019-10-10 LAB — COMPREHENSIVE METABOLIC PANEL
ALT: 10 U/L (ref 0–44)
AST: 16 U/L (ref 15–41)
Albumin: 4.7 g/dL (ref 3.5–5.0)
Alkaline Phosphatase: 37 U/L — ABNORMAL LOW (ref 38–126)
Anion gap: 8 (ref 5–15)
BUN: 15 mg/dL (ref 6–20)
CO2: 25 mmol/L (ref 22–32)
Calcium: 9.9 mg/dL (ref 8.9–10.3)
Chloride: 106 mmol/L (ref 98–111)
Creatinine, Ser: 0.78 mg/dL (ref 0.44–1.00)
GFR calc Af Amer: >60 mL/min (ref >60)
GFR calc non Af Amer: >60 mL/min (ref >60)
Glucose, Bld: 90 mg/dL (ref 70–99)
Potassium: 4.7 mmol/L (ref 3.5–5.1)
Sodium: 139 mmol/L (ref 135–145)
Total Bilirubin: 0.7 mg/dL (ref 0.3–1.2)
Total Protein: 7.7 g/dL (ref 6.5–8.1)

## 2019-10-10 LAB — MAGNESIUM: Magnesium: 2.2 mg/dL (ref 1.7–2.4)

## 2019-10-10 MED ORDER — MELOXICAM 15 MG PO TABS: 15.0000 mg | ORAL_TABLET | Freq: Every day | ORAL | 0 refills | Status: DC

## 2019-10-10 NOTE — ED Provider Notes (Addendum)
[facility] Emergency Department Provider Note  ____________________________________________   First MD Initiated Contact with Patient 10/10/19 1221     (approximate)  I have reviewed the triage vital signs and the nursing notes.   HISTORY  Chief Complaint Numbness   HPI Tracey Watson is a 33 y.o. female presents to ER after being seen at [facility] and brought to ED. patient complains of numbness to her right leg from her knee down to her ankle.  Patient denies any recent injury.  She states that she broke her foot approximately 2 years ago and was seen at [facility].  She states that she was running on October 4 and felt pain in her right knee.  She states that since that time she has had numbness in her foot.  She denies any injury during the time that she was running after her child.  She has taken an occasional ibuprofen once a day but has not taken any in the last week.  Patient continues to ambulate but states that she has difficulty moving her toes and sensation when she was shaving her legs is different.  She is also not traveled or had an injury to her leg that was suggest a DVT.  She denies any calf pain.  Currently she denies any pain.     Past Medical History:  Diagnosis Date  . Anxiety   . Benign carcinoid tumor of appendix   . Cancer ([place])    melanoma/appendix  . Ectopic pregnancy 08/11/2012  . Headache    migraines  . History of kidney stones    h/o  . Hot flashes   . Irregular heart beat     Patient Active Problem List   Diagnosis Date Noted  . Headache syndrome 08/18/2019  . Encounter for long-term (current) use of medications 08/18/2019  . Attention deficit disorder (ADD) in adult 05/30/2019  . Other fatigue 01/22/2019  . Acquired hypothyroidism 01/22/2019  . Generalized anxiety disorder 01/22/2019  . Seasonal allergies 01/13/2019  . Closed fracture of fourth metatarsal bone 09/28/2018  . Sprain of ankle 09/28/2018  . Sprain  of MCL (medial collateral ligament) of knee 09/28/2018  . 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 IUD contraception 10/15/2016  . Malignant melanoma of torso excluding breast ([place]) 10/15/2016    Past Surgical History:  Procedure Laterality Date  . APPENDECTOMY    . COLPOSCOPY  2003  . COMBINED HYSTEROSCOPY DIAGNOSTIC / D&C  08/11/2012  . HYSTEROSCOPY W/D&C  04/07/2018   Procedure: DILATATION AND CURETTAGE /HYSTEROSCOPY;  Surgeon: Malachy Mood, MD;  Location: [facility] ORS;  Service: Gynecology;;  . IUD REMOVAL N/A 04/07/2018   Procedure: INTRAUTERINE DEVICE (IUD) REMOVAL;  Surgeon: Malachy Mood, MD;  Location: [facility] ORS;  Service: Gynecology;  Laterality: N/A;  . LAPAROSCOPIC UNILATERAL SALPINGECTOMY Left 04/07/2018   Procedure: LAPAROSCOPIC LEFT SALPINGECTOMY;  Surgeon: Malachy Mood, MD;  Location: [facility] ORS;  Service: Gynecology;  Laterality: Left;  . LAPAROSCOPY  08/11/2012, 08/2012   Right salpingostomy Ectopic pregnancy by Klett, 08/2012 Right Salpingectomy remaining ectopic  . MELANOMA EXCISION    . SALPINGECTOMY  08/2012    Prior to Admission medications   Medication Sig Start Date End Date Taking? Authorizing Provider  ALPRAZolam (XANAX) 0.25 MG tablet Take 1 tablet (0.25 mg total) by mouth at bedtime as needed for anxiety. 01/13/19   Ronnell Freshwater, NP  amphetamine-dextroamphetamine (ADDERALL) 20 MG tablet Take 1 tablet (20 mg total) by mouth 2 (two) times daily. 09/15/19  Ronnell Freshwater, NP  ibuprofen (ADVIL) 800 MG tablet Take 1 tablet (800 mg total) by mouth every 6 (six) hours as needed. 08/18/19   Ronnell Freshwater, NP  meloxicam (MOBIC) 15 MG tablet Take 1 tablet (15 mg total) by mouth daily. 10/10/19 10/09/20  Johnn Hai, PA-C    Allergies Patient has no known allergies.  No family history on file.  Social History Social History   Tobacco Use  . Smoking status: Current Every Day Smoker    Packs/day: 0.25    Years: 17.00    Pack years: 4.25     Types: Cigarettes  . Smokeless tobacco: Never Used  Substance Use Topics  . Alcohol use: Yes    Comment: Socially  . Drug use: No    Review of Systems Constitutional: No fever/chills Cardiovascular: Denies chest pain. Respiratory: Denies shortness of breath. Gastrointestinal: No abdominal pain.  No nausea, no vomiting. Musculoskeletal: Negative for back pain. Skin: Negative for rash. Neurological: Negative for headaches.  Positive for numbness right leg. ____________________________________________   PHYSICAL EXAM:  VITAL SIGNS: ED Triage Vitals  Enc Vitals Group     BP 10/10/19 1213 118/78     Pulse Rate 10/10/19 1213 75     Resp 10/10/19 1213 19     Temp 10/10/19 1213 98.3 F (36.8 C)     Temp Source 10/10/19 1213 Oral     SpO2 10/10/19 1213 100 %     Weight 10/10/19 1140 129 lb (58.5 kg)     Height 10/10/19 1140 5' 4.75" (1.645 m)     Head Circumference --      Peak Flow --      Pain Score 10/10/19 1140 0     Pain Loc --      Pain Edu? --      Excl. in [place]? --    Constitutional: Alert and oriented. Well appearing and in no acute distress. Eyes: Conjunctivae are normal.  Head: Atraumatic. Neck: No stridor.   Cardiovascular: Normal rate, regular rhythm. Grossly normal heart sounds.  Good peripheral circulation. Respiratory: Normal respiratory effort.  No retractions. Lungs CTAB. Musculoskeletal: On examination of both lower extremities there is no gross deformity noted in comparison and no soft tissue injury present.  No discoloration present.  Pulses are equal bilaterally.  Motor sensory function intact however patient states that sensation in her right foot is less then in her left.  Patient is unable to move her digits in her right foot.  No edema is present bilateral malleolus areas.  Skin is warm and dry.  Negative Homans' sign. Neurologic:  Normal speech and language. No gross focal neurologic deficits are appreciated. No gait instability. Skin:  Skin is warm,  dry and intact.  No discoloration present. Psychiatric: Mood and affect are normal. Speech and behavior are normal.  ____________________________________________   LABS (all labs ordered are listed, but only abnormal results are displayed)  Labs Reviewed  CBC WITH DIFFERENTIAL/PLATELET - Abnormal; Notable for the following components:      Result Value   HCT 46.1 (*)    All other components within normal limits  COMPREHENSIVE METABOLIC PANEL - Abnormal; Notable for the following components:   Alkaline Phosphatase 37 (*)    All other components within normal limits  MAGNESIUM    RADIOLOGY  Official radiology report(s): Dg Tibia/fibula Right  Result Date: 10/10/2019 CLINICAL DATA:  Right lower leg pain for 1 month. EXAM: RIGHT TIBIA AND FIBULA - 2 VIEW COMPARISON:  None. FINDINGS: There is no evidence of fracture or other focal bone lesions. Soft tissues are unremarkable. IMPRESSION: Negative. Electronically Signed   By: Marijo Conception M.D.   On: 10/10/2019 13:08   Dg Foot Complete Right  Result Date: 10/10/2019 CLINICAL DATA:  Right foot pain and numbness. EXAM: RIGHT FOOT COMPLETE - 3+ VIEW COMPARISON:  None. FINDINGS: There is no evidence of fracture or dislocation. There is no evidence of arthropathy or other focal bone abnormality. Soft tissues are unremarkable. IMPRESSION: Negative. Electronically Signed   By: Marijo Conception M.D.   On: 10/10/2019 13:10    ____________________________________________   PROCEDURES  Procedure(s) performed (including Critical Care):  Procedures   ____________________________________________   INITIAL IMPRESSION / ASSESSMENT AND PLAN / ED COURSE  As part of my medical decision making, I reviewed the following data within the electronic MEDICAL RECORD NUMBER Notes from prior ED visits and [place] Controlled Substance Database   33 year old female presents to the ED with complaint of numbness in her right leg from her knee down to her toes  starting on October 4 after she ran after her child.  She denies any injury or falls during that time.  She does continue to have some numbness in her foot and today states that she "cannot move her toes".  Patient has continued to be ambulatory without any assistance.  She has occasionally taken ibuprofen.  She was seen [facility] at [facility] and brought to the ED for further evaluation.  Baseline labs and magnesium levels were within normal limits.  X-rays were negative for any bony disease or injury.  Patient was made aware that she would need to follow-up with an orthopedist and also future EMG study.  Patient was also started on meloxicam 15 mg 1 daily.  At this time there is no explanation for her current paresthesia.  ____________________________________________   FINAL CLINICAL IMPRESSION(S) / ED DIAGNOSES  Final diagnoses:  Neuropathy of right lower extremity     ED Discharge Orders         Ordered    meloxicam (MOBIC) 15 MG tablet  Daily     10/10/19 1335           Note:  This document was prepared using Dragon voice recognition software and may include unintentional dictation errors.    Johnn Hai, PA-C 10/10/19 1638    Johnn Hai, PA-C 10/10/19 1639    Blake Divine, MD 10/11/19 1322

## 2019-10-10 NOTE — ED Triage Notes (Addendum)
Says was running on oct 4 and felt pain in knee.  Now she has gradually gotten numness in foot and cant move her toes.  Able to walk without difficulty.  Brought from [facility] Also says history of fx in that foot

## 2019-10-10 NOTE — Discharge Instructions (Signed)
Follow-up with your primary care provider if any continued medication as needed.  Call make an appointment with Dr. Melrose Nakayama who is the neurologist [facility].  Take meloxicam 1 daily with food for inflammation.

## 2019-10-11 DIAGNOSIS — R200 Anesthesia of skin: Secondary | ICD-10-CM | POA: Insufficient documentation

## 2019-10-11 DIAGNOSIS — M25561 Pain in right knee: Secondary | ICD-10-CM | POA: Insufficient documentation

## 2019-10-16 ENCOUNTER — Other Ambulatory Visit: Payer: Self-pay | Admitting: Nurse Practitioner

## 2019-10-16 DIAGNOSIS — F988 Other specified behavioral and emotional disorders with onset usually occurring in childhood and adolescence: Secondary | ICD-10-CM

## 2019-10-31 ENCOUNTER — Encounter: Payer: Self-pay | Admitting: Psychiatry

## 2019-10-31 ENCOUNTER — Ambulatory Visit (INDEPENDENT_AMBULATORY_CARE_PROVIDER_SITE_OTHER): Payer: 59 | Admitting: Psychiatry

## 2019-10-31 ENCOUNTER — Other Ambulatory Visit: Payer: Self-pay

## 2019-10-31 DIAGNOSIS — F411 Generalized anxiety disorder: Secondary | ICD-10-CM

## 2019-10-31 DIAGNOSIS — F4310 Post-traumatic stress disorder, unspecified: Secondary | ICD-10-CM | POA: Diagnosis not present

## 2019-10-31 DIAGNOSIS — F900 Attention-deficit hyperactivity disorder, predominantly inattentive type: Secondary | ICD-10-CM | POA: Diagnosis not present

## 2019-10-31 MED ORDER — SERTRALINE HCL 50 MG PO TABS: 50.0000 mg | ORAL_TABLET | Freq: Every day | ORAL | 1 refills | Status: DC
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
## 2019-11-12 ENCOUNTER — Encounter: Payer: Self-pay | Admitting: Nurse Practitioner

## 2019-11-12 ENCOUNTER — Other Ambulatory Visit: Payer: Self-pay | Admitting: Nurse Practitioner

## 2019-11-12 DIAGNOSIS — F988 Other specified behavioral and emotional disorders with onset usually occurring in childhood and adolescence: Secondary | ICD-10-CM

## 2019-11-12 MED ORDER — AMPHETAMINE-DEXTROAMPHETAMINE 20 MG PO TABS: 20.0000 mg | ORAL_TABLET | Freq: Two times a day (BID) | ORAL | 0 refills | Status: DC

## 2019-11-12 NOTE — Progress Notes (Signed)
Renewed current prescription for adderall twice daily as needed for focus and concentration. May be filled on 11/13/2019

## 2019-11-15 ENCOUNTER — Telehealth: Payer: Self-pay

## 2019-11-15 NOTE — Telephone Encounter (Signed)
CONFIRMED AND SCREENED FOR 11-21-19 OV. °

## 2019-11-20 ENCOUNTER — Telehealth: Payer: Self-pay

## 2019-11-20 NOTE — Telephone Encounter (Signed)
CONFIRMED 11-21-19 AS VIRTUAL.

## 2019-11-21 ENCOUNTER — Ambulatory Visit (INDEPENDENT_AMBULATORY_CARE_PROVIDER_SITE_OTHER): Payer: 59 | Admitting: Nurse Practitioner

## 2019-11-21 ENCOUNTER — Other Ambulatory Visit: Payer: Self-pay | Admitting: Nurse Practitioner

## 2019-11-21 DIAGNOSIS — R5383 Other fatigue: Secondary | ICD-10-CM

## 2019-11-21 DIAGNOSIS — F988 Other specified behavioral and emotional disorders with onset usually occurring in childhood and adolescence: Secondary | ICD-10-CM | POA: Diagnosis not present

## 2019-11-21 DIAGNOSIS — F4310 Post-traumatic stress disorder, unspecified: Secondary | ICD-10-CM

## 2019-11-21 MED ORDER — AMPHETAMINE-DEXTROAMPHETAMINE 20 MG PO TABS: 20.0000 mg | ORAL_TABLET | Freq: Two times a day (BID) | ORAL | 0 refills | Status: DC

## 2019-11-21 NOTE — Progress Notes (Signed)
Refilled prescription for adderall 20mg  twice daily as needed. Sent three 30 day prescriptions to her pharmacy. Dates are 11/21/2019, 12/20/2019, and 01/18/2020

## 2019-11-22 ENCOUNTER — Encounter: Payer: Self-pay | Admitting: Nurse Practitioner
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
## 2019-12-18 ENCOUNTER — Other Ambulatory Visit: Payer: Self-pay | Admitting: Nurse Practitioner

## 2019-12-18 DIAGNOSIS — F988 Other specified behavioral and emotional disorders with onset usually occurring in childhood and adolescence: Secondary | ICD-10-CM

## 2019-12-18 MED ORDER — AMPHETAMINE-DEXTROAMPHETAMINE 20 MG PO TABS: 20.0000 mg | ORAL_TABLET | Freq: Two times a day (BID) | ORAL | 0 refills | Status: DC

## 2019-12-18 NOTE — Telephone Encounter (Signed)
Spoke to the pharmacy and will change rx to every 30 days

## 2019-12-19 ENCOUNTER — Ambulatory Visit: Payer: 59 | Admitting: Psychiatry

## 2019-12-19 ENCOUNTER — Other Ambulatory Visit: Payer: Self-pay

## 2020-02-16 ENCOUNTER — Telehealth: Payer: Self-pay

## 2020-02-16 NOTE — Telephone Encounter (Signed)
LMOM FOR PATIENT TO CONFIRM AND SCREEN FOR 02-20-20 OV.

## 2020-02-16 NOTE — Telephone Encounter (Signed)
CONFIRMED AND SCREENED FOR 02-20-20 OV.

## 2020-02-20 ENCOUNTER — Ambulatory Visit: Payer: 59 | Admitting: Nurse Practitioner

## 2020-02-20 ENCOUNTER — Encounter: Payer: Self-pay | Admitting: Nurse Practitioner

## 2020-02-20 ENCOUNTER — Other Ambulatory Visit: Payer: Self-pay

## 2020-02-20 VITALS — BP 128/81 | HR 77 | Temp 97.2°F | Resp 16 | Ht 64.0 in | Wt 124.4 lb

## 2020-02-20 DIAGNOSIS — R5383 Other fatigue: Secondary | ICD-10-CM | POA: Diagnosis not present

## 2020-02-20 DIAGNOSIS — F988 Other specified behavioral and emotional disorders with onset usually occurring in childhood and adolescence: Secondary | ICD-10-CM | POA: Diagnosis not present

## 2020-02-20 DIAGNOSIS — F411 Generalized anxiety disorder: Secondary | ICD-10-CM | POA: Diagnosis not present

## 2020-02-20 DIAGNOSIS — Z79899 Other long term (current) drug therapy: Secondary | ICD-10-CM | POA: Diagnosis not present

## 2020-02-20 LAB — POCT URINE DRUG SCREEN
POC Amphetamine UR: POSITIVE — AB
POC BENZODIAZEPINES UR: NOT DETECTED
POC Barbiturate UR: NOT DETECTED
POC Cocaine UR: NOT DETECTED
POC Ecstasy UR: NOT DETECTED
POC Marijuana UR: NOT DETECTED
POC Methadone UR: NOT DETECTED
POC Methamphetamine UR: NOT DETECTED
POC Opiate Ur: NOT DETECTED
POC Oxycodone UR: NOT DETECTED
POC PHENCYCLIDINE UR: NOT DETECTED
POC TRICYCLICS UR: NOT DETECTED

## 2020-02-20 MED ORDER — AMPHETAMINE-DEXTROAMPHETAMINE 20 MG PO TABS: 20.0000 mg | ORAL_TABLET | Freq: Three times a day (TID) | ORAL | 0 refills | Status: DC | PRN

## 2020-02-20 MED ORDER — ALPRAZOLAM 0.25 MG PO TABS: 0.2500 mg | ORAL_TABLET | Freq: Every evening | ORAL | 2 refills | Status: DC | PRN
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
## 2020-04-10 ENCOUNTER — Other Ambulatory Visit: Payer: Self-pay | Admitting: Nurse Practitioner

## 2020-04-10 ENCOUNTER — Telehealth: Payer: Self-pay

## 2020-04-10 DIAGNOSIS — J309 Allergic rhinitis, unspecified: Secondary | ICD-10-CM

## 2020-04-10 MED ORDER — METHYLPREDNISOLONE 4 MG PO TBPK: ORAL_TABLET | ORAL | 0 refills | Status: DC

## 2020-04-10 NOTE — Progress Notes (Signed)
Sent medrol dose pack to [facility]. She should take as directed for 6 days. She should also be taking OTC allergy medication like claritin or allegra. Use OTC eye drops as needed.

## 2020-04-10 NOTE — Telephone Encounter (Signed)
Disregard previous message. Called pt back and stated that she got steroid injections. Spoke with Tracey Watson and advised pt we can send in medrol dose pack or she can come in for an appt. Pt perfers medrol dose pack.

## 2020-04-10 NOTE — Telephone Encounter (Signed)
Sent medrol dose pack to [facility]. She should take as directed for 6 days. She should also be taking OTC allergy medication like claritin or allegra. Use OTC eye drops as needed.

## 2020-04-10 NOTE — Telephone Encounter (Signed)
Pt was notified.  

## 2020-05-08 ENCOUNTER — Telehealth: Payer: Self-pay

## 2020-05-08 NOTE — Telephone Encounter (Signed)
Left message for patient regarding allergy test benefits and asked pt to call back for further information. Beth

## 2020-05-23 ENCOUNTER — Other Ambulatory Visit: Payer: 59 | Admitting: Nurse Practitioner

## 2020-05-23 ENCOUNTER — Telehealth: Payer: Self-pay

## 2020-05-23 NOTE — Telephone Encounter (Signed)
lmom to confirm and screen for 05-23-20 ov.

## 2020-05-27 ENCOUNTER — Encounter: Payer: Self-pay | Admitting: Nurse Practitioner

## 2020-05-27 ENCOUNTER — Other Ambulatory Visit: Payer: Self-pay

## 2020-05-27 ENCOUNTER — Ambulatory Visit: Payer: 59 | Admitting: Nurse Practitioner

## 2020-05-27 VITALS — BP 118/76 | HR 68 | Temp 97.1°F | Resp 16 | Ht 64.0 in | Wt 124.6 lb

## 2020-05-27 DIAGNOSIS — F411 Generalized anxiety disorder: Secondary | ICD-10-CM | POA: Diagnosis not present

## 2020-05-27 DIAGNOSIS — R5383 Other fatigue: Secondary | ICD-10-CM

## 2020-05-27 DIAGNOSIS — F988 Other specified behavioral and emotional disorders with onset usually occurring in childhood and adolescence: Secondary | ICD-10-CM | POA: Diagnosis not present

## 2020-05-27 MED ORDER — ALPRAZOLAM 0.25 MG PO TABS: 0.2500 mg | ORAL_TABLET | Freq: Every evening | ORAL | 2 refills | Status: DC | PRN

## 2020-05-27 MED ORDER — AMPHETAMINE-DEXTROAMPHETAMINE 20 MG PO TABS: 20.0000 mg | ORAL_TABLET | Freq: Three times a day (TID) | ORAL | 0 refills | Status: DC | PRN
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
## 2020-05-31 ENCOUNTER — Telehealth: Payer: Self-pay

## 2020-05-31 NOTE — Telephone Encounter (Signed)
Confirmed and screened for 06-04-20 ov.

## 2020-06-04 ENCOUNTER — Other Ambulatory Visit: Payer: Self-pay

## 2020-06-04 ENCOUNTER — Ambulatory Visit (INDEPENDENT_AMBULATORY_CARE_PROVIDER_SITE_OTHER): Payer: 59 | Admitting: Nurse Practitioner

## 2020-06-04 ENCOUNTER — Encounter: Payer: Self-pay | Admitting: Nurse Practitioner

## 2020-06-04 VITALS — BP 106/75 | HR 76 | Temp 97.5°F | Resp 16 | Ht 64.0 in | Wt 123.6 lb

## 2020-06-04 DIAGNOSIS — Z0001 Encounter for general adult medical examination with abnormal findings: Secondary | ICD-10-CM | POA: Diagnosis not present

## 2020-06-04 DIAGNOSIS — F4310 Post-traumatic stress disorder, unspecified: Secondary | ICD-10-CM

## 2020-06-04 DIAGNOSIS — F988 Other specified behavioral and emotional disorders with onset usually occurring in childhood and adolescence: Secondary | ICD-10-CM | POA: Diagnosis not present

## 2020-06-04 DIAGNOSIS — F411 Generalized anxiety disorder: Secondary | ICD-10-CM

## 2020-06-04 DIAGNOSIS — R300 Dysuria: Secondary | ICD-10-CM

## 2020-06-04 NOTE — Progress Notes (Signed)
[facility] [place], [place] [address]  Internal MEDICINE  Office Visit Note  Patient Name: Tracey Watson  [id number]  [id number]  Date of Service: 06/15/2020   Pt is here for routine health maintenance examination   Chief Complaint  Patient presents with  . Annual Exam  . Anxiety     The patient is here for health maintenance exam. She is not due for pap smear at this time. Last time sh had pap was 02/2019 and was normal. She has no new concerns or complaints today. She is doing well with medications to help with generalized anxiety.   Current Medication: Outpatient Encounter Medications as of 06/04/2020  Medication Sig  . ALPRAZolam (XANAX) 0.25 MG tablet Take 1 tablet (0.25 mg total) by mouth at bedtime as needed for anxiety.  Marland Kitchen amphetamine-dextroamphetamine (ADDERALL) 20 MG tablet Take 1 tablet (20 mg total) by mouth 3 (three) times daily as needed.  . gabapentin (NEURONTIN) 100 MG capsule Take 100 mg twice a day for one week, then increase to 200 mg(2 tablets) twice a day and continue  . ibuprofen (ADVIL) 800 MG tablet Take 1 tablet (800 mg total) by mouth every 6 (six) hours as needed.  . loratadine (CLARITIN) 10 MG tablet Take by mouth.  . meloxicam (MOBIC) 15 MG tablet Take 1 tablet (15 mg total) by mouth daily.  . sertraline (ZOLOFT) 50 MG tablet Take 1 tablet (50 mg total) by mouth daily.   No facility-administered encounter medications on file as of 06/04/2020.    Surgical History: Past Surgical History:  Procedure Laterality Date  . APPENDECTOMY    . COLPOSCOPY  2003  . COMBINED HYSTEROSCOPY DIAGNOSTIC / D&C  08/11/2012  . HYSTEROSCOPY WITH D & C  04/07/2018   Procedure: DILATATION AND CURETTAGE /HYSTEROSCOPY;  Surgeon: Malachy Mood, MD;  Location: [facility];  Service: Gynecology;;  . IUD REMOVAL N/A 04/07/2018   Procedure: INTRAUTERINE DEVICE (IUD) REMOVAL;  Surgeon: Malachy Mood, MD;  Location: [facility];  Service: Gynecology;   Laterality: N/A;  . LAPAROSCOPIC UNILATERAL SALPINGECTOMY Left 04/07/2018   Procedure: LAPAROSCOPIC LEFT SALPINGECTOMY;  Surgeon: Malachy Mood, MD;  Location: [facility];  Service: Gynecology;  Laterality: Left;  . LAPAROSCOPY  08/11/2012, 08/2012   Right salpingostomy Ectopic pregnancy by Klett, 08/2012 Right Salpingectomy remaining ectopic  . MELANOMA EXCISION    . SALPINGECTOMY  08/2012    Medical History: Past Medical History:  Diagnosis Date  . Anxiety   . Benign carcinoid tumor of appendix   . Cancer ([place])    melanoma/appendix  . Ectopic pregnancy 08/11/2012  . Headache    migraines  . History of kidney stones    h/o  . Hot flashes   . Irregular heart beat     Family History: History reviewed. No pertinent family history.    Review of Systems  Constitutional: Negative for activity change, chills, fatigue and unexpected weight change.  HENT: Negative for congestion, postnasal drip, rhinorrhea, sneezing and sore throat.   Respiratory: Negative for cough, chest tightness and shortness of breath.   Cardiovascular: Negative for chest pain and palpitations.  Gastrointestinal: Negative for abdominal pain, constipation, diarrhea, nausea and vomiting.  Endocrine: Negative for cold intolerance, heat intolerance, polydipsia and polyuria.  Musculoskeletal: Negative for arthralgias, back pain, joint swelling and neck pain.  Skin: Negative for rash.  Allergic/Immunologic: Negative for environmental allergies.  Neurological: Positive for headaches. Negative for dizziness, tremors and numbness.  Hematological: Negative for adenopathy. Does not bruise/bleed  easily.  Psychiatric/Behavioral: Positive for behavioral problems (Depression) and decreased concentration. Negative for dysphoric mood, sleep disturbance and suicidal ideas. The patient is nervous/anxious.        Improved since her most recent visit. Does have some trouble with sleep due to anxiety.  Now seeing a therapist to help  her deal with anxiety. Was diagnosed with post-traumatic stress disorder. Is now on low dose of sertraline.      Today's Vitals   06/04/20 0936  BP: 106/75  Pulse: 76  Resp: 16  Temp: (!) 97.5 F (36.4 C)  SpO2: 100%  Weight: 123 lb 9.6 oz (56.1 kg)  Height: 5\' 4"  (1.626 m)   Body mass index is 21.22 kg/m.  Physical Exam Vitals and nursing note reviewed.  Constitutional:      General: She is not in acute distress.    Appearance: Normal appearance. She is well-developed. She is not diaphoretic.  HENT:     Head: Normocephalic and atraumatic.     Mouth/Throat:     Pharynx: No oropharyngeal exudate.  Eyes:     Conjunctiva/sclera: Conjunctivae normal.     Pupils: Pupils are equal, round, and reactive to light.  Neck:     Thyroid: No thyromegaly.     Vascular: No JVD.     Trachea: No tracheal deviation.  Cardiovascular:     Rate and Rhythm: Normal rate and regular rhythm.     Pulses: Normal pulses.     Heart sounds: Normal heart sounds. No murmur heard.  No friction rub. No gallop.   Pulmonary:     Effort: Pulmonary effort is normal. No respiratory distress.     Breath sounds: Normal breath sounds. No wheezing or rales.  Chest:     Chest wall: No tenderness.  Abdominal:     General: Bowel sounds are normal.     Palpations: Abdomen is soft.     Tenderness: There is no abdominal tenderness.  Musculoskeletal:        General: Normal range of motion.     Cervical back: Normal range of motion and neck supple.  Lymphadenopathy:     Cervical: No cervical adenopathy.  Skin:    General: Skin is warm and dry.  Neurological:     General: No focal deficit present.     Mental Status: She is alert and oriented to person, place, and time.     Cranial Nerves: No cranial nerve deficit.  Psychiatric:        Attention and Perception: Attention and perception normal.        Mood and Affect: Mood is anxious. Affect is tearful.        Speech: Speech normal.        Behavior: Behavior  normal. Behavior is cooperative.        Thought Content: Thought content normal.        Cognition and Memory: Cognition and memory normal.        Judgment: Judgment normal.     Comments: Mood stable on current medication.     LABS: Recent Results (from the past 2160 hour(s))  Urinalysis, Routine w reflex microscopic     Status: Abnormal   Collection Time: 06/04/20  9:39 AM  Result Value Ref Range   Specific Gravity, UA 1.021 1.005 - 1.030   pH, UA 5.0 5.0 - 7.5   Color, UA Yellow Yellow   Appearance Ur Clear Clear   Leukocytes,UA Negative Negative   Protein,UA Negative Negative/Trace   Glucose, UA  Negative Negative   Ketones, UA Trace (A) Negative   RBC, UA Negative Negative   Bilirubin, UA Negative Negative   Urobilinogen, Ur 0.2 0.2 - 1.0 mg/dL   Nitrite, UA Negative Negative   Microscopic Examination Comment     Comment: Microscopic not indicated and not performed.    Assessment/Plan: 1. Encounter for general adult medical examination with abnormal findings Annual wellness visit today.   2. Generalized anxiety disorder May continue alpra/zolam 0.25mg  at bedtime as needed for anxiety/insmonia.  3. PTSD (post-traumatic stress disorder) Patient should continue regular visits with counselor.   4. Attention deficit disorder (ADD) in adult Continue current dose of adderall as needed. No new prescriptions needed today  5. Dysuria - Urinalysis, Routine w reflex microscopic  General Counseling: Cymone verbalizes understanding of the findings of todays visit and agrees with plan of treatment. I have discussed any further diagnostic evaluation that may be needed or ordered today. We also reviewed her medications today. she has been encouraged to call the office with any questions or concerns that should arise related to todays visit.    Counseling:  This patient was seen by Leretha Pol FNP Collaboration with Dr Lavera Guise as a part of collaborative care  agreement  Orders Placed This Encounter  Procedures  . Urinalysis, Routine w reflex microscopic    Total time spent:30 Minutes  Time spent includes review of chart, medications, test results, and follow up plan with the patient.     Lavera Guise, MD  Internal Medicine

## 2020-06-05 LAB — URINALYSIS, ROUTINE W REFLEX MICROSCOPIC
Bilirubin, UA: NEGATIVE
Glucose, UA: NEGATIVE
Leukocytes,UA: NEGATIVE
Nitrite, UA: NEGATIVE
Protein,UA: NEGATIVE
RBC, UA: NEGATIVE
Specific Gravity, UA: 1.021 (ref 1.005–1.030)
Urobilinogen, Ur: 0.2 mg/dL (ref 0.2–1.0)
pH, UA: 5 (ref 5.0–7.5)

## 2020-06-15 DIAGNOSIS — R300 Dysuria: Secondary | ICD-10-CM | POA: Insufficient documentation

## 2020-06-15 DIAGNOSIS — Z0001 Encounter for general adult medical examination with abnormal findings: Secondary | ICD-10-CM | POA: Insufficient documentation

## 2020-08-23 ENCOUNTER — Encounter: Payer: Self-pay | Admitting: Nurse Practitioner

## 2020-08-23 ENCOUNTER — Other Ambulatory Visit: Payer: Self-pay | Admitting: Nurse Practitioner

## 2020-08-23 DIAGNOSIS — F988 Other specified behavioral and emotional disorders with onset usually occurring in childhood and adolescence: Secondary | ICD-10-CM

## 2020-08-23 MED ORDER — AMPHETAMINE-DEXTROAMPHETAMINE 20 MG PO TABS: 20.0000 mg | ORAL_TABLET | Freq: Three times a day (TID) | ORAL | 0 refills | Status: DC | PRN

## 2020-08-30 ENCOUNTER — Telehealth: Payer: Self-pay

## 2020-08-30 NOTE — Telephone Encounter (Signed)
[facility] CONFIRMED PT APPT 09/03/20. BR

## 2020-09-03 ENCOUNTER — Other Ambulatory Visit: Payer: Self-pay

## 2020-09-03 ENCOUNTER — Ambulatory Visit (INDEPENDENT_AMBULATORY_CARE_PROVIDER_SITE_OTHER): Payer: 59 | Admitting: Nurse Practitioner

## 2020-09-03 ENCOUNTER — Encounter: Payer: Self-pay | Admitting: Nurse Practitioner

## 2020-09-03 VITALS — BP 111/75 | HR 99 | Temp 97.6°F | Resp 16 | Ht 64.0 in | Wt 126.6 lb

## 2020-09-03 DIAGNOSIS — Z79899 Other long term (current) drug therapy: Secondary | ICD-10-CM | POA: Diagnosis not present

## 2020-09-03 DIAGNOSIS — G4489 Other headache syndrome: Secondary | ICD-10-CM

## 2020-09-03 DIAGNOSIS — F988 Other specified behavioral and emotional disorders with onset usually occurring in childhood and adolescence: Secondary | ICD-10-CM

## 2020-09-03 LAB — POCT URINE DRUG SCREEN
POC Amphetamine UR: POSITIVE — AB
POC BENZODIAZEPINES UR: NOT DETECTED
POC Barbiturate UR: NOT DETECTED
POC Cocaine UR: NOT DETECTED
POC Ecstasy UR: NOT DETECTED
POC Marijuana UR: NOT DETECTED
POC Methadone UR: NOT DETECTED
POC Methamphetamine UR: NOT DETECTED
POC Opiate Ur: NOT DETECTED
POC Oxycodone UR: NOT DETECTED
POC PHENCYCLIDINE UR: NOT DETECTED
POC TRICYCLICS UR: NOT DETECTED

## 2020-09-03 MED ORDER — AMPHETAMINE-DEXTROAMPHETAMINE 20 MG PO TABS: 20.0000 mg | ORAL_TABLET | Freq: Three times a day (TID) | ORAL | 0 refills | Status: DC | PRN

## 2020-09-03 MED ORDER — IBUPROFEN 800 MG PO TABS: 800.0000 mg | ORAL_TABLET | Freq: Four times a day (QID) | ORAL | 1 refills | Status: DC | PRN
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
## 2020-09-25 ENCOUNTER — Encounter: Payer: Self-pay | Admitting: Nurse Practitioner

## 2020-10-15 ENCOUNTER — Encounter: Payer: Self-pay | Admitting: Nurse Practitioner

## 2020-10-15 ENCOUNTER — Other Ambulatory Visit: Payer: Self-pay | Admitting: Obstetrics and Gynecology

## 2020-10-15 ENCOUNTER — Other Ambulatory Visit: Payer: Self-pay | Admitting: Nurse Practitioner

## 2020-10-15 DIAGNOSIS — B009 Herpesviral infection, unspecified: Secondary | ICD-10-CM

## 2020-10-15 MED ORDER — VALACYCLOVIR HCL 1 G PO TABS: 1000.0000 mg | ORAL_TABLET | Freq: Two times a day (BID) | ORAL | 2 refills | Status: DC

## 2020-10-21 ENCOUNTER — Other Ambulatory Visit: Payer: Self-pay | Admitting: Nurse Practitioner

## 2020-10-21 ENCOUNTER — Telehealth: Payer: Self-pay

## 2020-10-21 DIAGNOSIS — F988 Other specified behavioral and emotional disorders with onset usually occurring in childhood and adolescence: Secondary | ICD-10-CM

## 2020-10-21 MED ORDER — AMPHETAMINE-DEXTROAMPHETAMINE 20 MG PO TABS: 20.0000 mg | ORAL_TABLET | Freq: Three times a day (TID) | ORAL | 0 refills | Status: DC | PRN

## 2020-10-21 NOTE — Telephone Encounter (Signed)
Spoke with phar we only send 1 09/03/20 and filled on 09/20/20 pres as per Tracey Watson send 1 pres and make appt for next month

## 2020-11-03 ENCOUNTER — Telehealth: Payer: 59 | Admitting: Physician Assistant

## 2020-11-03 DIAGNOSIS — Z20822 Contact with and (suspected) exposure to covid-19: Secondary | ICD-10-CM

## 2020-11-03 DIAGNOSIS — J018 Other acute sinusitis: Secondary | ICD-10-CM

## 2020-11-03 DIAGNOSIS — R059 Cough, unspecified: Secondary | ICD-10-CM

## 2020-11-03 NOTE — Progress Notes (Signed)
E-Visit for Corona Virus Screening  Your current symptoms could be consistent with the coronavirus.  Many health care providers can now test patients at their office but not all are.  [place] has multiple testing sites. For information on our [place] testing locations and hours go to [email or web address]  We are enrolling you in our [place] for [place] . Daily you will receive a questionnaire within the [place] website. Our COVID 19 response team will be monitoring your responses daily.  Testing Information: The COVID-19 Community Testing sites are testing BY APPOINTMENT ONLY.  You can schedule online at [email or web address]  If you do not have access to a smart phone or computer you may call [phone] for an appointment.   Additional testing sites in the Community:  . For [facility] Testing sites in [facility]  FaceUpdate.[email or web address]  . For Pop-up testing sites in [place]  BowlDirectory.co.uk  . For [facility] [email or web address]  . For [facility] testing in [place] and [employer] [email or web address]  . For [facility] testing in [facility]   [email or web address]  For  more information about community testing call [phone]   Please quarantine yourself while awaiting your test results. Please stay home for a minimum of 10 days from the first day of illness with improving symptoms and you have had 24 hours of no fever (without the use of Tylenol (Acetaminophen) Motrin (Ibuprofen) or any fever reducing medication).  Also - Do not get tested prior to returning to work because once you have had a positive test the test can stay  positive for more than a month in some cases.   You should wear a mask or cloth face covering over your nose and mouth if you must be around other people or animals, including pets (even at home). Try to stay at least 6 feet away from other people. This will protect the people around you.  Please continue good preventive care measures, including:  frequent hand-washing, avoid touching your face, cover coughs/sneezes, stay out of crowds and keep a 6 foot distance from others.  COVID-19 is a respiratory illness with symptoms that are similar to the flu. Symptoms are typically mild to moderate, but there have been cases of severe illness and death due to the virus.   The following symptoms may appear 2-14 days after exposure: . Fever . Cough . Shortness of breath or difficulty breathing . Chills . Repeated shaking with chills . Muscle pain . Headache . Sore throat . New loss of taste or smell . Fatigue . Congestion or runny nose . Nausea or vomiting . Diarrhea  Go to the nearest hospital ED for assessment if fever/cough/breathlessness are severe or illness seems like a threat to life.  It is vitally important that if you feel that you have an infection such as this virus or any other virus that you stay home and away from places where you may spread it to others.  You should avoid contact with people age 65 and older.   You can use medication such as A prescription cough medication called Tessalon Perles 100 mg. You may take 1-2 capsules every 8 hours as needed for cough and I have prescribed Fluticasone nasal spray 2 sprays in each nostril one time per day. For persistent nasal congestion for more than one week, the purulent rhinorrhea, I have prescribed Augmentin 875mg /125mg  one tablet twice daily with food, for 7 days.   I have also provided a work note.  You may  also take acetaminophen (Tylenol) as needed for fever.  Reduce your risk of any infection by using the same precautions used  for avoiding the common cold or flu:  Marland Kitchen Wash your hands often with soap and warm water for at least 20 seconds.  If soap and water are not readily available, use an alcohol-based hand sanitizer with at least 60% alcohol.  . If coughing or sneezing, cover your mouth and nose by coughing or sneezing into the elbow areas of your shirt or coat, into a tissue or into your sleeve (not your hands). . Avoid shaking hands with others and consider head nods or verbal greetings only. . Avoid touching your eyes, nose, or mouth with unwashed hands.  . Avoid close contact with people who are sick. . Avoid places or events with large numbers of people in one location, like concerts or sporting events. . Carefully consider travel plans you have or are making. . If you are planning any travel outside or inside the [place], visit the CDC's Travelers' Health webpage for the latest health notices. . If you have some symptoms but not all symptoms, continue to monitor at home and seek medical attention if your symptoms worsen. . If you are having a medical emergency, call 911.  HOME CARE . Only take medications as instructed by your medical team. . Drink plenty of fluids and get plenty of rest. . A steam or ultrasonic humidifier can help if you have congestion.   GET HELP RIGHT AWAY IF YOU HAVE EMERGENCY WARNING SIGNS** FOR COVID-19. If you or someone is showing any of these signs seek emergency medical care immediately. Call 911 or proceed to your closest emergency facility if: . You develop worsening high fever. . Trouble breathing . Bluish lips or face . Persistent pain or pressure in the chest . New confusion . Inability to wake or stay awake . You cough up blood. . Your symptoms become more severe  **This list is not all possible symptoms. Contact your medical provider for any symptoms that are sever or concerning to you.  MAKE SURE YOU   Understand these instructions.  Will watch your condition.  Will  get help right away if you are not doing well or get worse.  Your e-visit answers were reviewed by a board certified advanced clinical practitioner to complete your personal care plan.  Depending on the condition, your plan could have included both over the counter or prescription medications.  If there is a problem please reply once you have received a response from your provider.  Your safety is important to [place].  If you have drug allergies check your prescription carefully.    You can use MyChart to ask questions about today's visit, request a non-urgent call back, or ask for a work or school excuse for 24 hours related to this e-Visit. If it has been greater than 24 hours you will need to follow up with your provider, or enter a new e-Visit to address those concerns. You will get an e-mail in the next two days asking about your experience.  I hope that your e-visit has been valuable and will speed your recovery. Thank you for using e-visits.   I spent 5-10 minutes on review and completion of this note- Lacy Duverney [facility]

## 2020-11-19 ENCOUNTER — Encounter: Payer: Self-pay | Admitting: Nurse Practitioner

## 2020-11-19 ENCOUNTER — Other Ambulatory Visit: Payer: Self-pay

## 2020-11-19 ENCOUNTER — Ambulatory Visit: Payer: 59 | Admitting: Nurse Practitioner

## 2020-11-19 VITALS — BP 123/84 | Temp 98.2°F | Resp 16 | Ht 64.0 in | Wt 130.6 lb

## 2020-11-19 DIAGNOSIS — K1230 Oral mucositis (ulcerative), unspecified: Secondary | ICD-10-CM

## 2020-11-19 DIAGNOSIS — F988 Other specified behavioral and emotional disorders with onset usually occurring in childhood and adolescence: Secondary | ICD-10-CM

## 2020-11-19 DIAGNOSIS — F4310 Post-traumatic stress disorder, unspecified: Secondary | ICD-10-CM | POA: Diagnosis not present

## 2020-11-19 MED ORDER — NYSTATIN 100000 UNIT/ML MT SUSP: OROMUCOSAL | 1 refills | Status: DC

## 2020-11-19 MED ORDER — AMPHETAMINE-DEXTROAMPHETAMINE 20 MG PO TABS: 20.0000 mg | ORAL_TABLET | Freq: Every day | ORAL | 0 refills | Status: DC | PRN

## 2020-11-19 MED ORDER — AMPHETAMINE-DEXTROAMPHET ER 20 MG PO CP24: 20.0000 mg | ORAL_CAPSULE | Freq: Every day | ORAL | 0 refills | Status: DC

## 2020-11-19 NOTE — Progress Notes (Signed)
[facility] [place], [place] [address]  Internal MEDICINE  Office Visit Note  Patient Name: Tracey Watson  [id number]  [id number]  Date of Service: 12/22/2020  Chief Complaint  Patient presents with  . Follow-up  . Quality Metric Gaps    covid  . controlled substance form    reviewed with PT  . Dental Pain    teeth causing problems    The patient is here for follow up visit.  -recently had telehealth visit with provider from her insurance. Was treated with antibiotics for sinus infection. Since then, has had terrible pain of her gums and teeth. Gums are ared and swollen. Tongue is irritated. Neither hot or cold food feels good in her mouth.  -currently treated for adult ADD. Given conner's self-assessment for adult add. She scored 5/6 suggesting she does have adult add. She currently takes Adderall 20mg  up to three times daily as needed to help her with focus and concentration. She works 12 to 13 hour days.She states that she is doing well on current doses of medication. Does report dry mouth, but no other negative side effects from adderall.  -she continues to see counselor to help her manage symptoms associated with PTSD.       Current Medication: Outpatient Encounter Medications as of 11/19/2020  Medication Sig  . ALPRAZolam (XANAX) 0.25 MG tablet Take 1 tablet (0.25 mg total) by mouth at bedtime as needed for anxiety.  Marland Kitchen ibuprofen (ADVIL) 800 MG tablet Take 1 tablet (800 mg total) by mouth every 6 (six) hours as needed.  . loratadine (CLARITIN) 10 MG tablet Take by mouth.  . valACYclovir (VALTREX) 1000 MG tablet TAKE ONE TABLET BY MOUTH DAILY FOR FIVE DAYS  . valACYclovir (VALTREX) 1000 MG tablet Take 1 tablet (1,000 mg total) by mouth 2 (two) times daily.  . [DISCONTINUED] amphetamine-dextroamphetamine (ADDERALL) 20 MG tablet Take 1 tablet (20 mg total) by mouth 3 (three) times daily as needed.  . [DISCONTINUED] amphetamine-dextroamphetamine  (ADDERALL) 20 MG tablet Take 1 tablet (20 mg total) by mouth daily as needed.  . nystatin (MYCOSTATIN) 100000 UNIT/ML suspension Swish and swallow with 58mls QID  . [DISCONTINUED] amphetamine-dextroamphetamine (ADDERALL XR) 20 MG 24 hr capsule Take 1 capsule (20 mg total) by mouth daily.   No facility-administered encounter medications on file as of 11/19/2020.    Surgical History: Past Surgical History:  Procedure Laterality Date  . APPENDECTOMY    . COLPOSCOPY  2003  . COMBINED HYSTEROSCOPY DIAGNOSTIC / D&C  08/11/2012  . HYSTEROSCOPY WITH D & C  04/07/2018   Procedure: DILATATION AND CURETTAGE /HYSTEROSCOPY;  Surgeon: Malachy Mood, MD;  Location: [facility];  Service: Gynecology;;  . IUD REMOVAL N/A 04/07/2018   Procedure: INTRAUTERINE DEVICE (IUD) REMOVAL;  Surgeon: Malachy Mood, MD;  Location: [facility];  Service: Gynecology;  Laterality: N/A;  . LAPAROSCOPIC UNILATERAL SALPINGECTOMY Left 04/07/2018   Procedure: LAPAROSCOPIC LEFT SALPINGECTOMY;  Surgeon: Malachy Mood, MD;  Location: [facility];  Service: Gynecology;  Laterality: Left;  . LAPAROSCOPY  08/11/2012, 08/2012   Right salpingostomy Ectopic pregnancy by Klett, 08/2012 Right Salpingectomy remaining ectopic  . MELANOMA EXCISION    . SALPINGECTOMY  08/2012    Medical History: Past Medical History:  Diagnosis Date  . Anxiety   . Benign carcinoid tumor of appendix   . Cancer ([place])    melanoma/appendix  . Ectopic pregnancy 08/11/2012  . Headache    migraines  . History of kidney stones  h/o  . Hot flashes   . Irregular heart beat     Family History: History reviewed. No pertinent family history.  Social History   Socioeconomic History  . Marital status: Single    Spouse name: Not on file  . Number of children: 1  . Years of education: Not on file  . Highest education level: High school graduate  Occupational History  . Not on file  Tobacco Use  . Smoking status: Current Every Day Smoker     Packs/day: 0.25    Years: 17.00    Pack years: 4.25    Types: E-cigarettes  . Smokeless tobacco: Never Used  Vaping Use  . Vaping Use: Not on file  Substance and Sexual Activity  . Alcohol use: Yes    Comment: Socially  . Drug use: No  . Sexual activity: Yes    Partners: Male    Birth control/protection: Surgical    Comment: Salpingectomy   Other Topics Concern  . Not on file  Social History Narrative   Scottie ralley, ex - boyfriend.    Social Determinants of Health   Financial Resource Strain: Not on file  Food Insecurity: Not on file  Transportation Needs: Not on file  Physical Activity: Not on file  Stress: Not on file  Social Connections: Not on file  Intimate Partner Violence: Not on file      Review of Systems  Constitutional: Negative for activity change, chills, fatigue and unexpected weight change.  HENT: Positive for dental problem and mouth sores. Negative for congestion, postnasal drip, rhinorrhea, sneezing and sore throat.   Respiratory: Negative for cough, chest tightness, shortness of breath and wheezing.   Cardiovascular: Negative for chest pain and palpitations.  Gastrointestinal: Negative for abdominal pain, constipation, diarrhea, nausea and vomiting.  Endocrine: Negative for cold intolerance, heat intolerance, polydipsia and polyuria.  Musculoskeletal: Negative for arthralgias, back pain, joint swelling and neck pain.  Skin: Negative for rash.  Allergic/Immunologic: Negative for environmental allergies.  Neurological: Negative for dizziness, tremors, numbness and headaches.  Hematological: Negative for adenopathy. Does not bruise/bleed easily.  Psychiatric/Behavioral: Positive for decreased concentration. Negative for behavioral problems (Depression), sleep disturbance and suicidal ideas. The patient is nervous/anxious.        Symptoms are currently well managed on current medication.    Today's Vitals   11/19/20 1609  BP: 123/84  Resp: 16   Temp: 98.2 F (36.8 C)  SpO2: 98%  Weight: 130 lb 9.6 oz (59.2 kg)  Height: 5\' 4"  (1.626 m)   Body mass index is 22.42 kg/m.  Physical Exam Vitals and nursing note reviewed.  Constitutional:      General: She is not in acute distress.    Appearance: Normal appearance. She is well-developed and well-nourished. She is not diaphoretic.  HENT:     Head: Normocephalic and atraumatic.     Mouth/Throat:     Mouth: Oropharynx is clear and moist. Oral lesions present.     Pharynx: No oropharyngeal exudate.     Comments: Erythema of the mouth and gums. No open sores present. Dental caries are present.  Eyes:     Extraocular Movements: EOM normal.     Pupils: Pupils are equal, round, and reactive to light.  Neck:     Thyroid: No thyromegaly.     Vascular: No JVD.     Trachea: No tracheal deviation.  Cardiovascular:     Rate and Rhythm: Normal rate and regular rhythm.     Heart sounds:  Normal heart sounds. No murmur heard. No friction rub. No gallop.   Pulmonary:     Effort: Pulmonary effort is normal. No respiratory distress.     Breath sounds: Normal breath sounds. No wheezing or rales.  Chest:     Chest wall: No tenderness.  Abdominal:     Palpations: Abdomen is soft.  Musculoskeletal:        General: Normal range of motion.     Cervical back: Normal range of motion and neck supple.  Lymphadenopathy:     Cervical: No cervical adenopathy.  Skin:    General: Skin is warm and dry.  Neurological:     General: No focal deficit present.     Mental Status: She is alert and oriented to person, place, and time.     Cranial Nerves: No cranial nerve deficit.  Psychiatric:        Attention and Perception: Attention and perception normal.        Mood and Affect: Affect normal. Mood is anxious.        Speech: Speech normal.        Behavior: Behavior normal. Behavior is cooperative.        Thought Content: Thought content normal.        Cognition and Memory: Cognition and memory  normal.        Judgment: Judgment normal.     Comments: Patient scored 5/6 on Conner's self assessment for ADD.   Assessment/Plan: 1. Oral mucositis Likely due to recent antibiotics. Start nystatin suspension. Swish and swallow with 5mls four times daily for a week.  - nystatin (MYCOSTATIN) 100000 UNIT/ML suspension; Swish and swallow with 40mls QID  Dispense: 500 mL; Refill: 1  2. Attention deficit disorder (ADD) in adult Patient scored 5/6 on Conner's self assessment for adult ADD. Trial of Adderall XR 20mg  daily. May take adderall 20mg  in the afternoon as needed. Single 30 day prescriptions were sent to her pharmacy for both medications.   3. PTSD (post-traumatic stress disorder) Continue regular visits with counselor as scheduled.   General Counseling: Laira verbalizes understanding of the findings of todays visit and agrees with plan of treatment. I have discussed any further diagnostic evaluation that may be needed or ordered today. We also reviewed her medications today. she has been encouraged to call the office with any questions or concerns that should arise related to todays visit.  This patient was seen by Bertram with Dr Lavera Guise as a part of collaborative care agreement  Meds ordered this encounter  Medications  . nystatin (MYCOSTATIN) 100000 UNIT/ML suspension    Sig: Swish and swallow with 74mls QID    Dispense:  500 mL    Refill:  1    Order Specific Question:   Supervising Provider    Answer:   Lavera Guise [place]  . DISCONTD: amphetamine-dextroamphetamine (ADDERALL XR) 20 MG 24 hr capsule    Sig: Take 1 capsule (20 mg total) by mouth daily.    Dispense:  30 capsule    Refill:  0    Order Specific Question:   Supervising Provider    Answer:   Lavera Guise [[id number]]  . DISCONTD: amphetamine-dextroamphetamine (ADDERALL) 20 MG tablet    Sig: Take 1 tablet (20 mg total) by mouth daily as needed.    Dispense:  30 tablet    Refill:  0    To  use in afternoon    Order Specific Question:   Supervising  Provider    Answer:   Lavera Guise [[id number]]    Total time spent: 40 Minutes   Time spent includes review of chart, medications, test results, and follow up plan with the patient.      Dr Lavera Guise Internal medicine

## 2020-12-02 ENCOUNTER — Ambulatory Visit: Payer: 59 | Admitting: Nurse Practitioner

## 2020-12-12 ENCOUNTER — Encounter: Payer: Self-pay | Admitting: Nurse Practitioner

## 2020-12-12 ENCOUNTER — Other Ambulatory Visit: Payer: Self-pay

## 2020-12-12 ENCOUNTER — Ambulatory Visit: Payer: 59 | Admitting: Nurse Practitioner

## 2020-12-12 VITALS — BP 109/79 | Temp 97.8°F | Resp 16 | Ht 64.0 in | Wt 127.0 lb

## 2020-12-12 DIAGNOSIS — F988 Other specified behavioral and emotional disorders with onset usually occurring in childhood and adolescence: Secondary | ICD-10-CM | POA: Diagnosis not present

## 2020-12-12 DIAGNOSIS — G4489 Other headache syndrome: Secondary | ICD-10-CM

## 2020-12-12 DIAGNOSIS — F4310 Post-traumatic stress disorder, unspecified: Secondary | ICD-10-CM

## 2020-12-12 MED ORDER — AMPHETAMINE-DEXTROAMPHETAMINE 20 MG PO TABS: 20.0000 mg | ORAL_TABLET | Freq: Three times a day (TID) | ORAL | 0 refills | Status: DC | PRN

## 2020-12-12 NOTE — Progress Notes (Signed)
[facility] [place], [place] [address]  Internal MEDICINE  Office Visit Note  Patient Name: Tracey Watson  [id number]  [id number]  Date of Service: 12/12/2020  Chief Complaint  Patient presents with  . Follow-up    The patient is here for routine follow up. Trial of Adderall XR 20mg  along with an immediate acting adderall 20mg  in the afternoon if needed. She states that she hated the change. She states that the XR took a long time to kick in and would be effective in waves. This gave her headaches. The extended release made it difficult for her to sleep. She did much better taking adderall up to three times daily. She states that she doesn't always need the third dose of adderall. Saves the tird dose for very long days. She states that this really helps her to handle stress and stay focused while working. She works 12 to 13 hour days.       Current Medication: Outpatient Encounter Medications as of 12/12/2020  Medication Sig  . ALPRAZolam (XANAX) 0.25 MG tablet Take 1 tablet (0.25 mg total) by mouth at bedtime as needed for anxiety.  Marland Kitchen ibuprofen (ADVIL) 800 MG tablet Take 1 tablet (800 mg total) by mouth every 6 (six) hours as needed.  . loratadine (CLARITIN) 10 MG tablet Take by mouth.  . nystatin (MYCOSTATIN) 100000 UNIT/ML suspension Swish and swallow with 63mls QID  . valACYclovir (VALTREX) 1000 MG tablet TAKE ONE TABLET BY MOUTH DAILY FOR FIVE DAYS  . valACYclovir (VALTREX) 1000 MG tablet Take 1 tablet (1,000 mg total) by mouth 2 (two) times daily.  . [DISCONTINUED] amphetamine-dextroamphetamine (ADDERALL XR) 20 MG 24 hr capsule Take 1 capsule (20 mg total) by mouth daily.  . [DISCONTINUED] amphetamine-dextroamphetamine (ADDERALL) 20 MG tablet Take 1 tablet (20 mg total) by mouth daily as needed.  Marland Kitchen amphetamine-dextroamphetamine (ADDERALL) 20 MG tablet Take 1 tablet (20 mg total) by mouth 3 (three) times daily as needed.  . [DISCONTINUED]  amphetamine-dextroamphetamine (ADDERALL) 20 MG tablet Take 1 tablet (20 mg total) by mouth 3 (three) times daily as needed.  . [DISCONTINUED] amphetamine-dextroamphetamine (ADDERALL) 20 MG tablet Take 1 tablet (20 mg total) by mouth 3 (three) times daily as needed.   No facility-administered encounter medications on file as of 12/12/2020.    Surgical History: Past Surgical History:  Procedure Laterality Date  . APPENDECTOMY    . COLPOSCOPY  2003  . COMBINED HYSTEROSCOPY DIAGNOSTIC / D&C  08/11/2012  . HYSTEROSCOPY WITH D & C  04/07/2018   Procedure: DILATATION AND CURETTAGE /HYSTEROSCOPY;  Surgeon: Malachy Mood, MD;  Location: [facility];  Service: Gynecology;;  . IUD REMOVAL N/A 04/07/2018   Procedure: INTRAUTERINE DEVICE (IUD) REMOVAL;  Surgeon: Malachy Mood, MD;  Location: [facility];  Service: Gynecology;  Laterality: N/A;  . LAPAROSCOPIC UNILATERAL SALPINGECTOMY Left 04/07/2018   Procedure: LAPAROSCOPIC LEFT SALPINGECTOMY;  Surgeon: Malachy Mood, MD;  Location: [facility];  Service: Gynecology;  Laterality: Left;  . LAPAROSCOPY  08/11/2012, 08/2012   Right salpingostomy Ectopic pregnancy by Klett, 08/2012 Right Salpingectomy remaining ectopic  . MELANOMA EXCISION    . SALPINGECTOMY  08/2012    Medical History: Past Medical History:  Diagnosis Date  . Anxiety   . Benign carcinoid tumor of appendix   . Cancer ([place])    melanoma/appendix  . Ectopic pregnancy 08/11/2012  . Headache    migraines  . History of kidney stones    h/o  . Hot flashes   .  Irregular heart beat     Family History: History reviewed. No pertinent family history.  Social History   Socioeconomic History  . Marital status: Single    Spouse name: Not on file  . Number of children: 1  . Years of education: Not on file  . Highest education level: High school graduate  Occupational History  . Not on file  Tobacco Use  . Smoking status: Current Every Day Smoker    Packs/day: 0.25    Years:  17.00    Pack years: 4.25    Types: E-cigarettes  . Smokeless tobacco: Never Used  Vaping Use  . Vaping Use: Not on file  Substance and Sexual Activity  . Alcohol use: Yes    Comment: Socially  . Drug use: No  . Sexual activity: Yes    Partners: Male    Birth control/protection: Surgical    Comment: Salpingectomy   Other Topics Concern  . Not on file  Social History Narrative   Scottie ralley, ex - boyfriend.    Social Determinants of Health   Financial Resource Strain: Not on file  Food Insecurity: Not on file  Transportation Needs: Not on file  Physical Activity: Not on file  Stress: Not on file  Social Connections: Not on file  Intimate Partner Violence: Not on file      Review of Systems  Constitutional: Negative for activity change, chills, fatigue and unexpected weight change.  HENT: Negative for congestion, postnasal drip, rhinorrhea, sneezing and sore throat.   Respiratory: Negative for cough, chest tightness and shortness of breath.   Cardiovascular: Negative for chest pain and palpitations.  Gastrointestinal: Negative for abdominal pain, constipation, diarrhea, nausea and vomiting.  Endocrine: Negative for cold intolerance, heat intolerance, polydipsia and polyuria.  Musculoskeletal: Negative for arthralgias, back pain, joint swelling and neck pain.  Skin: Negative for rash.  Allergic/Immunologic: Negative for environmental allergies.  Neurological: Positive for headaches. Negative for dizziness, tremors and numbness.  Hematological: Negative for adenopathy. Does not bruise/bleed easily.  Psychiatric/Behavioral: Positive for behavioral problems (Depression) and decreased concentration. Negative for dysphoric mood, sleep disturbance and suicidal ideas. The patient is nervous/anxious.        Patient did not do well with Adderall 20mg  XR. Did not work as quickly as she needed it to, then lasted for a lot longer into the day. States that this medication also gave  her significant headaches.     Today's Vitals   12/12/20 0835  BP: 109/79  Resp: 16  Temp: 97.8 F (36.6 C)  SpO2: 98%  Weight: 127 lb (57.6 kg)  Height: 5\' 4"  (1.626 m)   Body mass index is 21.8 kg/m.  Physical Exam Vitals and nursing note reviewed.  Constitutional:      General: She is not in acute distress.    Appearance: Normal appearance. She is well-developed and well-nourished. She is not diaphoretic.  HENT:     Head: Normocephalic and atraumatic.     Mouth/Throat:     Mouth: Oropharynx is clear and moist.     Pharynx: No oropharyngeal exudate.  Eyes:     Extraocular Movements: EOM normal.     Pupils: Pupils are equal, round, and reactive to light.  Neck:     Thyroid: No thyromegaly.     Vascular: No JVD.     Trachea: No tracheal deviation.  Cardiovascular:     Rate and Rhythm: Normal rate and regular rhythm.     Heart sounds: Normal heart sounds. No murmur  heard. No friction rub. No gallop.   Pulmonary:     Effort: Pulmonary effort is normal. No respiratory distress.     Breath sounds: Normal breath sounds. No wheezing or rales.  Chest:     Chest wall: No tenderness.  Abdominal:     Palpations: Abdomen is soft.  Musculoskeletal:        General: Normal range of motion.     Cervical back: Normal range of motion and neck supple.  Lymphadenopathy:     Cervical: No cervical adenopathy.  Skin:    General: Skin is warm and dry.  Neurological:     General: No focal deficit present.     Mental Status: She is alert and oriented to person, place, and time.     Cranial Nerves: No cranial nerve deficit.  Psychiatric:        Mood and Affect: Mood and affect and mood normal.        Behavior: Behavior normal.        Thought Content: Thought content normal.        Judgment: Judgment normal.    Assessment/Plan: 1. Headache syndrome Headaches well managed, overall.   2. Attention deficit disorder (ADD) in adult Patient had scored 5/6 on Conner's self-  assessment for adult onset ADD at her most recent visit. Did not do well with Adderall XR dosing. Caused negative side effects. Will got back to adderall 20mg  tablets up to three times daily as needed for focus and concentration. Patient to take third dose only when absolutely necessary. Patient also to take medication holidays on weekends and days off. Three 30 day prescriptions provided to her pharmacy today. Dates are 12/12/2020, 01/10/2021, and 02/08/2021 - amphetamine-dextroamphetamine (ADDERALL) 20 MG tablet; Take 1 tablet (20 mg total) by mouth 3 (three) times daily as needed.  Dispense: 90 tablet; Refill: 0  3. PTSD (post-traumatic stress disorder) Patient continues to see mental health counselor for PTSD  General Counseling: Viviana verbalizes understanding of the findings of todays visit and agrees with plan of treatment. I have discussed any further diagnostic evaluation that may be needed or ordered today. We also reviewed her medications today. she has been encouraged to call the office with any questions or concerns that should arise related to todays visit.   Refilled Controlled medications today. Reviewed risks and possible side effects associated with taking Stimulants. Combination of these drugs with other psychotropic medications could cause dizziness and drowsiness. Pt needs to Monitor symptoms and exercise caution in driving and operating heavy machinery to avoid damages to oneself, to others and to the surroundings. Patient verbalized understanding in this matter. Dependence and abuse for these drugs will be monitored closely. A Controlled substance policy and procedure is on file which allows [facility] to order a urine drug screen test at any visit. Patient understands and agrees with the plan..  Meds ordered this encounter  Medications  . DISCONTD: amphetamine-dextroamphetamine (ADDERALL) 20 MG tablet    Sig: Take 1 tablet (20 mg total) by mouth 3 (three) times daily  as needed.    Dispense:  90 tablet    Refill:  0    Going back to adderall TID dosing    Order Specific Question:   Supervising Provider    Answer:   Lavera Guise [[id number]]  . DISCONTD: amphetamine-dextroamphetamine (ADDERALL) 20 MG tablet    Sig: Take 1 tablet (20 mg total) by mouth 3 (three) times daily as needed.    Dispense:  90  tablet    Refill:  0    To fill after 01/10/2021    Order Specific Question:   Supervising Provider    Answer:   Lavera Guise [id number]  . amphetamine-dextroamphetamine (ADDERALL) 20 MG tablet    Sig: Take 1 tablet (20 mg total) by mouth 3 (three) times daily as needed.    Dispense:  90 tablet    Refill:  0    To fill after 02/08/2021    Order Specific Question:   Supervising Provider    Answer:   Lavera Guise [id number]    Total time spent: 25 Minutes  Time spent includes review of chart, medications, test results, and follow up plan with the patient.      Dr Lavera Guise Internal medicine

## 2020-12-22 DIAGNOSIS — K1230 Oral mucositis (ulcerative), unspecified: Secondary | ICD-10-CM | POA: Insufficient documentation

## 2021-01-10 ENCOUNTER — Other Ambulatory Visit: Payer: Self-pay | Admitting: Nurse Practitioner

## 2021-01-10 DIAGNOSIS — J029 Acute pharyngitis, unspecified: Secondary | ICD-10-CM

## 2021-01-10 MED ORDER — AMOXICILLIN 400 MG/5ML PO SUSR: 800.0000 mg | Freq: Two times a day (BID) | ORAL | 0 refills | Status: DC

## 2021-02-25 ENCOUNTER — Other Ambulatory Visit: Payer: Self-pay

## 2021-02-25 ENCOUNTER — Ambulatory Visit (INDEPENDENT_AMBULATORY_CARE_PROVIDER_SITE_OTHER): Payer: 59 | Admitting: Nurse Practitioner

## 2021-02-25 ENCOUNTER — Encounter: Payer: Self-pay | Admitting: Nurse Practitioner

## 2021-02-25 VITALS — BP 121/83 | HR 63 | Temp 98.0°F | Ht 64.0 in | Wt 127.5 lb

## 2021-02-25 DIAGNOSIS — F988 Other specified behavioral and emotional disorders with onset usually occurring in childhood and adolescence: Secondary | ICD-10-CM

## 2021-02-25 DIAGNOSIS — Z7689 Persons encountering health services in other specified circumstances: Secondary | ICD-10-CM | POA: Diagnosis not present

## 2021-02-25 DIAGNOSIS — Z7251 High risk heterosexual behavior: Secondary | ICD-10-CM | POA: Insufficient documentation

## 2021-02-25 DIAGNOSIS — R5383 Other fatigue: Secondary | ICD-10-CM | POA: Diagnosis not present

## 2021-02-25 DIAGNOSIS — F4310 Post-traumatic stress disorder, unspecified: Secondary | ICD-10-CM

## 2021-02-25 DIAGNOSIS — Z202 Contact with and (suspected) exposure to infections with a predominantly sexual mode of transmission: Secondary | ICD-10-CM | POA: Insufficient documentation

## 2021-02-25 DIAGNOSIS — Z0000 Encounter for general adult medical examination without abnormal findings: Secondary | ICD-10-CM | POA: Insufficient documentation

## 2021-02-25 MED ORDER — AMPHETAMINE-DEXTROAMPHETAMINE 20 MG PO TABS: 20.0000 mg | ORAL_TABLET | Freq: Three times a day (TID) | ORAL | 0 refills | Status: DC | PRN

## 2021-02-25 NOTE — Patient Instructions (Signed)
Safe Sex Practicing safe sex means taking steps before and during sex to reduce your risk of:  Getting an STI (sexually transmitted infection).  Giving your partner an STI.  Unwanted or unplanned pregnancy. How to practice safe sex Ways you can practice safe sex  Limit your sexual partners to only one partner who is having sex with only you.  Avoid using alcohol and drugs before having sex. Alcohol and drugs can affect your judgment.  Before having sex with a new partner: ? Talk to your partner about past partners, past STIs, and drug use. ? Get screened for STIs and discuss the results with your partner. Ask your partner to get screened too.  Check your body regularly for sores, blisters, rashes, or unusual discharge. If you notice any of these problems, visit your health care provider.  Avoid sexual contact if you have symptoms of an infection or you are being treated for an STI.  While having sex, use a condom. Make sure to: ? Use a condom every time you have vaginal, oral, or anal sex. Both females and males should wear condoms during oral sex. ? Keep condoms in place from the beginning to the end of sexual activity. ? Use a latex condom, if possible. Latex condoms offer the best protection. ? Use only water-based lubricants with a condom. Using petroleum-based lubricants or oils will weaken the condom and increase the chance that it will break.   Ways your health care provider can help you practice safe sex  See your health care provider for regular screenings, exams, and tests for STIs.  Talk with your health care provider about what kind of birth control (contraception) is best for you.  Get vaccinated against hepatitis B and human papillomavirus (HPV).  If you are at risk of being infected with HIV (human immunodeficiency virus), talk with your health care provider about taking a prescription medicine to prevent HIV infection. You are at risk for HIV if you: ? Are a man  who has sex with other men. ? Are sexually active with more than one partner. ? Take drugs by injection. ? Have a sex partner who has HIV. ? Have unprotected sex. ? Have sex with someone who has sex with both men and women. ? Have had an STI.   Follow these instructions at home:  Take over-the-counter and prescription medicines only as told by your health care provider.  Keep all follow-up visits. This is important. Where to find more information  Centers for Disease Control and Prevention: http://www.wolf.info/  Planned Parenthood: www.plannedparenthood.org  Office on [employer]: [place] Summary  Practicing safe sex means taking steps before and during sex to reduce your risk getting an STI, giving your partner an STI, and having an unwanted or unplanned pregnancy.  Before having sex with a new partner, talk to your partner about past partners, past STIs, and drug use.  Use a condom every time you have vaginal, oral, or anal sex. Both females and males should wear condoms during oral sex.  Check your body regularly for sores, blisters, rashes, or unusual discharge. If you notice any of these problems, visit your health care provider.  See your health care provider for regular screenings, exams, and tests for STIs. This information is not intended to replace advice given to you by your health care provider. Make sure you discuss any questions you have with your health care provider. Document Revised: 05/13/2020 Document Reviewed: 05/13/2020 Elsevier Patient Education  [place].
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
## 2021-02-26 LAB — TSH: TSH: 2.55 u[IU]/mL (ref 0.450–4.500)

## 2021-02-26 LAB — COMPREHENSIVE METABOLIC PANEL
ALT: 8 IU/L (ref 0–32)
AST: 14 IU/L (ref 0–40)
Albumin/Globulin Ratio: 1.7 (ref 1.2–2.2)
Albumin: 4.8 g/dL (ref 3.8–4.8)
Alkaline Phosphatase: 57 IU/L (ref 44–121)
BUN/Creatinine Ratio: 14 (ref 9–23)
BUN: 11 mg/dL (ref 6–20)
Bilirubin Total: 0.8 mg/dL (ref 0.0–1.2)
CO2: 21 mmol/L (ref 20–29)
Calcium: 9.7 mg/dL (ref 8.7–10.2)
Chloride: 102 mmol/L (ref 96–106)
Creatinine, Ser: 0.78 mg/dL (ref 0.57–1.00)
Globulin, Total: 2.9 g/dL (ref 1.5–4.5)
Glucose: 82 mg/dL (ref 65–99)
Potassium: 4.3 mmol/L (ref 3.5–5.2)
Sodium: 139 mmol/L (ref 134–144)
Total Protein: 7.7 g/dL (ref 6.0–8.5)
eGFR: 102 mL/min/{1.73_m2} (ref >59)

## 2021-02-26 LAB — LIPID PANEL
Chol/HDL Ratio: 1.8 ratio (ref 0.0–4.4)
Cholesterol, Total: 175 mg/dL (ref 100–199)
HDL: 97 mg/dL (ref >39)
LDL Chol Calc (NIH): 61 mg/dL (ref 0–99)
Triglycerides: 96 mg/dL (ref 0–149)
VLDL Cholesterol Cal: 17 mg/dL (ref 5–40)

## 2021-02-26 LAB — GC/CHLAMYDIA PROBE AMP
Chlamydia trachomatis, NAA: NEGATIVE
Neisseria Gonorrhoeae by PCR: NEGATIVE

## 2021-02-26 LAB — HEMOGLOBIN A1C
Est. average glucose Bld gHb Est-mCnc: 91 mg/dL
Hgb A1c MFr Bld: 4.8 % (ref 4.8–5.6)

## 2021-02-26 LAB — CBC
Hematocrit: 48.6 % — ABNORMAL HIGH (ref 34.0–46.6)
Hemoglobin: 16.3 g/dL — ABNORMAL HIGH (ref 11.1–15.9)
MCH: 30.6 pg (ref 26.6–33.0)
MCHC: 33.5 g/dL (ref 31.5–35.7)
MCV: 91 fL (ref 79–97)
Platelets: 311 10*3/uL (ref 150–450)
RBC: 5.33 x10E6/uL — ABNORMAL HIGH (ref 3.77–5.28)
RDW: 11.9 % (ref 11.7–15.4)
WBC: 7.3 10*3/uL (ref 3.4–10.8)

## 2021-02-26 LAB — RPR: RPR Ser Ql: NONREACTIVE

## 2021-02-26 LAB — HIV ANTIBODY (ROUTINE TESTING W REFLEX): HIV Screen 4th Generation wRfx: NONREACTIVE

## 2021-02-26 NOTE — Progress Notes (Signed)
Blood work looks good. Negative HIV and RPR. Hgb and Hct slightly elevated. Will recheck. Waiting on urine results.

## 2021-02-26 NOTE — Progress Notes (Signed)
Hey. Please let the patient know that I have her lab results. Her screen for STDs was all negative. Her blood work was also good. Her Hemoglobin was just a little high. This is something I would just like to monitor. There is no treatment for this as elevation is so small. All other labs were great. Thanks.

## 2021-03-11 ENCOUNTER — Other Ambulatory Visit: Payer: Self-pay | Admitting: Nurse Practitioner

## 2021-03-11 ENCOUNTER — Encounter: Payer: Self-pay | Admitting: Nurse Practitioner

## 2021-03-11 DIAGNOSIS — J029 Acute pharyngitis, unspecified: Secondary | ICD-10-CM

## 2021-03-11 MED ORDER — AMOXICILLIN 400 MG/5ML PO SUSR: 800.0000 mg | Freq: Two times a day (BID) | ORAL | 0 refills | Status: DC

## 2021-03-11 NOTE — Progress Notes (Signed)
Patient having bad taste, smell since have mild head injury in end of February. Trial of amoxicillin 800mg  twice daily for next 10 days. New rx sent to [facility].

## 2021-03-12 ENCOUNTER — Other Ambulatory Visit: Payer: Self-pay | Admitting: Nurse Practitioner

## 2021-03-12 ENCOUNTER — Ambulatory Visit: Payer: 59 | Admitting: Hospice and Palliative Medicine

## 2021-03-12 DIAGNOSIS — F411 Generalized anxiety disorder: Secondary | ICD-10-CM

## 2021-03-12 MED ORDER — ALPRAZOLAM 0.25 MG PO TABS: 0.2500 mg | ORAL_TABLET | Freq: Every evening | ORAL | 1 refills | Status: DC | PRN

## 2021-03-12 NOTE — Progress Notes (Signed)
Sent new rx for alprazolam 0.25mg  at bedtime as needed to [facility]. Sent #30 tablets with 1 refill.

## 2021-04-03 ENCOUNTER — Encounter: Payer: Self-pay | Admitting: Nurse Practitioner

## 2021-04-03 DIAGNOSIS — C4359 Malignant melanoma of other part of trunk: Secondary | ICD-10-CM

## 2021-05-02 ENCOUNTER — Encounter: Payer: Self-pay | Admitting: Nurse Practitioner

## 2021-05-05 ENCOUNTER — Other Ambulatory Visit: Payer: Self-pay | Admitting: Nurse Practitioner

## 2021-05-05 DIAGNOSIS — Z87898 Personal history of other specified conditions: Secondary | ICD-10-CM

## 2021-05-05 MED ORDER — SCOPOLAMINE 1 MG/3DAYS TD PT72: 1.0000 | MEDICATED_PATCH | TRANSDERMAL | 12 refills | Status: DC

## 2021-05-05 NOTE — Progress Notes (Signed)
New prescription for transdermal scopolamine patches sent to [facility]. Place new patch on skin, usually behind the ear, every three days. Remove old patch when applying new patch.

## 2021-05-28 ENCOUNTER — Telehealth: Payer: Self-pay | Admitting: Nurse Practitioner

## 2021-05-28 ENCOUNTER — Other Ambulatory Visit: Payer: Self-pay | Admitting: Nurse Practitioner

## 2021-05-28 ENCOUNTER — Ambulatory Visit: Payer: 59 | Admitting: Nurse Practitioner

## 2021-05-28 DIAGNOSIS — F988 Other specified behavioral and emotional disorders with onset usually occurring in childhood and adolescence: Secondary | ICD-10-CM

## 2021-05-28 MED ORDER — AMPHETAMINE-DEXTROAMPHETAMINE 20 MG PO TABS: 20.0000 mg | ORAL_TABLET | Freq: Three times a day (TID) | ORAL | 0 refills | Status: DC | PRN

## 2021-05-28 NOTE — Telephone Encounter (Signed)
Patient made aware.

## 2021-05-28 NOTE — Progress Notes (Signed)
Single 30 day prescription for adderall sent to [facility] today

## 2021-05-28 NOTE — Telephone Encounter (Signed)
Error

## 2021-05-28 NOTE — Telephone Encounter (Signed)
Please let her know that single 30 day prescription for adderall sent to [facility] today. Thanks.

## 2021-05-28 NOTE — Telephone Encounter (Signed)
Patient was a no show for today and needs a refill on her Adderall and would like to know if she can get some. She is rescheduled for June 20. Thanks

## 2021-06-09 ENCOUNTER — Ambulatory Visit: Payer: 59 | Admitting: Nurse Practitioner

## 2021-06-10 ENCOUNTER — Encounter: Payer: 59 | Admitting: Nurse Practitioner

## 2021-06-17 ENCOUNTER — Ambulatory Visit (INDEPENDENT_AMBULATORY_CARE_PROVIDER_SITE_OTHER): Payer: 59 | Admitting: Nurse Practitioner

## 2021-06-17 ENCOUNTER — Other Ambulatory Visit: Payer: Self-pay

## 2021-06-17 ENCOUNTER — Encounter: Payer: Self-pay | Admitting: Nurse Practitioner

## 2021-06-17 VITALS — BP 116/75 | HR 77 | Temp 98.6°F | Ht 64.0 in | Wt 126.4 lb

## 2021-06-17 DIAGNOSIS — F988 Other specified behavioral and emotional disorders with onset usually occurring in childhood and adolescence: Secondary | ICD-10-CM | POA: Diagnosis not present

## 2021-06-17 DIAGNOSIS — R5383 Other fatigue: Secondary | ICD-10-CM | POA: Diagnosis not present

## 2021-06-17 MED ORDER — AMPHETAMINE-DEXTROAMPHETAMINE 20 MG PO TABS: 20.0000 mg | ORAL_TABLET | Freq: Three times a day (TID) | ORAL | 0 refills | Status: DC | PRN

## 2021-06-17 MED ORDER — AMPHETAMINE-DEXTROAMPHETAMINE 20 MG PO TABS
20.0000 mg | ORAL_TABLET | Freq: Three times a day (TID) | ORAL | 0 refills | Status: DC | PRN
Start: 2021-06-17 — End: 2021-06-17

## 2021-06-17 NOTE — Progress Notes (Signed)
Established Patient Office Visit  Subjective:  Patient ID: Tracey Watson, female    DOB: June 15, 1986  Age: 35 y.o. MRN: [id number]  CC:  Chief Complaint  Patient presents with   Follow-up    HPI AARIAH Watson presents for follow up visit. She has diagnosis of adult ADD. She is currently prescribed adderall 66m. Will sometimes take this up to three times daily when needed. She has a very stressful job. She works long hours and requires her to travel. She is also the single mom to a 77year old daughter. Reviewed her PDMP profile. Her overdose risk score is 120 and her fill history is appropriate, with her last adderall refill being 05/30/2021. She denies negative side effects associated with taking adderall. She took ASRS 1.1 at her most recent visit in 02/2021.  She states that she is doing well with her anxiety. Did need to have refill for her alprazolam 0.219m she states that she took it as prescribed for a few days and had has not needed it since. She states that she takes only if needed and this is generally at night when anxiety keeps her from sleeping well.  She reports no new concerns or complaints today. She denies chest pain, chest pressure, or shortness of breath. She denies headaches or visual disturbances. She denies abdominal pain, nausea, vomiting, or changes in bowel or bladder habits.     Past Medical History:  Diagnosis Date   Anxiety    Benign carcinoid tumor of appendix    [place]   [place]an[place]   Ectopic pregnancy 08/11/2012   Headache    migraines   History of kidney stones    h/o   Hot flashes    Irregular heart beat     Past Surgical History:  Procedure Laterality Date   APPENDECTOMY     COLPOSCOPY  2003   COMBINED HYSTEROSCOPY DIAGNOSTIC / D&C  08/11/2012   HYSTEROSCOPY WITH D & C  04/07/2018   Procedure: DILATATION AND CURETTAGE /HYSTEROSCOPY;  Surgeon: StMalachy MoodMD;  Location: [facility];  Service: Gynecology;;   IUD REMOVAL N/A  04/07/2018   Procedure: INTRAUTERINE DEVICE (IUD) REMOVAL;  Surgeon: StMalachy MoodMD;  Location: [facility];  Service: Gynecology;  Laterality: N/A;   LAPAROSCOPIC UNILATERAL SALPINGECTOMY Left 04/07/2018   Procedure: LAPAROSCOPIC LEFT SALPINGECTOMY;  Surgeon: StMalachy MoodMD;  Location: [facility];  Service: Gynecology;  Laterality: Left;   LAPAROSCOPY  08/11/2012, 08/2012   Right salpingostomy Ectopic pregnancy by [facility], 08/2012 Right Salpingectomy remaining ectopic   MELANOMA EXCISION     SALPINGECTOMY  08/2012    History reviewed. No pertinent family history.  Social History   Socioeconomic History   Marital status: Single    Spouse name: Not on file   Number of children: 1   Years of education: Not on file   Highest education level: High school graduate  Occupational History   Not on file  Tobacco Use   Smoking status: Every Day    Packs/day: 0.25    Years: 17.00    Pack years: 4.25    Types: E-cigarettes, Cigarettes   Smokeless tobacco: Never  Vaping Use   Vaping Use: Not on file  Substance and Sexual Activity   Alcohol use: Yes    Comment: Socially   Drug use: No   Sexual activity: Yes    Partners: Male    Birth control/protection: Surgical    Comment: Salpingectomy   Other Topics Concern  Not on file  Social History Narrative   Scottie ralley, ex - boyfriend.    Social Determinants of Health   Financial Resource Strain: Not on file  Food Insecurity: Not on file  Transportation Needs: Not on file  Physical Activity: Not on file  Stress: Not on file  Social Connections: Not on file  Intimate Partner Violence: Not on file    Outpatient Medications Prior to Visit  Medication Sig Dispense Refill   ALPRAZolam (XANAX) 0.25 MG tablet Take 1 tablet (0.25 mg total) by mouth at bedtime as needed for anxiety. 30 tablet 1   ibuprofen (ADVIL) 800 MG tablet Take 1 tablet (800 mg total) by mouth every 6 (six) hours as needed. 90 tablet 1   loratadine (CLARITIN)  10 MG tablet Take by mouth.     nystatin (MYCOSTATIN) 100000 UNIT/ML suspension Swish and swallow with 81ms QID 500 mL 1   scopolamine (TRANSDERM-SCOP) 1 MG/3DAYS Place 1 patch (1.5 mg total) onto the skin every 3 (three) days. 12 each 12   valACYclovir (VALTREX) 1000 MG tablet TAKE ONE TABLET BY MOUTH DAILY FOR FIVE DAYS 5 tablet 6   valACYclovir (VALTREX) 1000 MG tablet Take 1 tablet (1,000 mg total) by mouth 2 (two) times daily. 14 tablet 2   amoxicillin (AMOXIL) 400 MG/5ML suspension Take 10 mLs (800 mg total) by mouth 2 (two) times daily. 200 mL 0   amphetamine-dextroamphetamine (ADDERALL) 20 MG tablet Take 1 tablet (20 mg total) by mouth 3 (three) times daily as needed. 90 tablet 0   No facility-administered medications prior to visit.    No Known Allergies  ROS Review of Systems  Constitutional:  Negative for activity change, chills, fatigue and unexpected weight change.  HENT:  Negative for congestion, postnasal drip, rhinorrhea, sinus pressure, sinus pain, sneezing and sore throat.   Eyes: Negative.  Negative for redness.  Respiratory:  Negative for cough, chest tightness, shortness of breath and wheezing.   Cardiovascular:  Negative for chest pain and palpitations.  Gastrointestinal:  Negative for abdominal pain, constipation, diarrhea and vomiting.  Endocrine: Negative for cold intolerance, heat intolerance, polydipsia and polyuria.  Genitourinary: Negative.  Negative for dysuria and frequency.  Musculoskeletal:  Negative for arthralgias, back pain, joint swelling and neck pain.  Skin:  Negative for rash.  Allergic/Immunologic: Negative.   Neurological:  Negative for dizziness, tremors, numbness and headaches.  Hematological:  Negative for adenopathy. Does not bruise/bleed easily.  Psychiatric/Behavioral:  Positive for decreased concentration and sleep disturbance. Negative for behavioral problems (Depression) and suicidal ideas. The patient is nervous/anxious.        Doing  well with current medication as prescribed      Objective:    Physical Exam Vitals and nursing note reviewed.  Constitutional:      Appearance: Normal appearance. She is well-developed.  HENT:     Head: Normocephalic and atraumatic.     Nose: Nose normal.     Mouth/Throat:     Mouth: Mucous membranes are moist.  Eyes:     Extraocular Movements: Extraocular movements intact.     Conjunctiva/sclera: Conjunctivae normal.     Pupils: Pupils are equal, round, and reactive to light.  Cardiovascular:     Rate and Rhythm: Normal rate and regular rhythm.     Pulses: Normal pulses.     Heart sounds: Normal heart sounds.  Pulmonary:     Effort: Pulmonary effort is normal.     Breath sounds: Normal breath sounds.  Abdominal:  Palpations: Abdomen is soft.  Musculoskeletal:        General: Normal range of motion.     Cervical back: Normal range of motion and neck supple.  Lymphadenopathy:     Cervical: No cervical adenopathy.  Skin:    General: Skin is warm and dry.     Capillary Refill: Capillary refill takes less than 2 seconds.  Neurological:     General: No focal deficit present.     Mental Status: She is alert and oriented to person, place, and time.  Psychiatric:        Mood and Affect: Mood normal.        Behavior: Behavior normal.        Thought Content: Thought content normal.        Judgment: Judgment normal.   Today's Vitals   06/17/21 1457  BP: 116/75  Pulse: 77  Temp: 98.6 F (37 C)  SpO2: 100%  Weight: 126 lb 6.4 oz (57.3 kg)   Body mass index is 21.7 kg/m.   Wt Readings from Last 3 Encounters:  06/17/21 126 lb 6.4 oz (57.3 kg)  02/25/21 127 lb 8 oz (57.8 kg)  12/12/20 127 lb (57.6 kg)     Health Maintenance Due  Topic Date Due   Pneumococcal Vaccine 56-51 Years old (1 - PCV) Never done   COVID-19 Vaccine (3 - Pfizer risk series) 05/23/2021    There are no preventive care reminders to display for this patient.  Lab Results  Component Value  Date   TSH 2.550 02/25/2021   Lab Results  Component Value Date   WBC 7.3 02/25/2021   HGB 16.3 (H) 02/25/2021   HCT 48.6 (H) 02/25/2021   MCV 91 02/25/2021   PLT 311 02/25/2021   Lab Results  Component Value Date   NA 139 02/25/2021   K 4.3 02/25/2021   CO2 21 02/25/2021   GLUCOSE 82 02/25/2021   BUN 11 02/25/2021   CREATININE 0.78 02/25/2021   BILITOT 0.8 02/25/2021   ALKPHOS 57 02/25/2021   AST 14 02/25/2021   ALT 8 02/25/2021   PROT 7.7 02/25/2021   ALBUMIN 4.8 02/25/2021   CALCIUM 9.7 02/25/2021   ANIONGAP 8 10/10/2019   EGFR 102 02/25/2021   Lab Results  Component Value Date   CHOL 175 02/25/2021   Lab Results  Component Value Date   HDL 97 02/25/2021   Lab Results  Component Value Date   LDLCALC 61 02/25/2021   Lab Results  Component Value Date   TRIG 96 02/25/2021   Lab Results  Component Value Date   CHOLHDL 1.8 02/25/2021   Lab Results  Component Value Date   HGBA1C 4.8 02/25/2021      Assessment & Plan:  1. Other fatigue Likely due to busy and stressful life. Improved with improved anxiety  2. Attention deficit disorder (ADD) in adult Doing well on current dose adderall. PDMP profile reviewed today with overdose risk score at 120 and appropriate fill history. Sned three 30 day prescriptions to her pharmacy. Dates are 06/17/2021, 07/15/2021, and 08/13/2021 - amphetamine-dextroamphetamine (ADDERALL) 20 MG tablet; Take 1 tablet (20 mg total) by mouth 3 (three) times daily as needed.  Dispense: 90 tablet; Refill: 0   Problem List Items Addressed This Visit       Other   Other fatigue - Primary   Attention deficit disorder (ADD) in adult   Relevant Medications   amphetamine-dextroamphetamine (ADDERALL) 20 MG tablet    Meds ordered this  encounter  Medications   DISCONTD: amphetamine-dextroamphetamine (ADDERALL) 20 MG tablet    Sig: Take 1 tablet (20 mg total) by mouth 3 (three) times daily as needed.    Dispense:  90 tablet    Refill:  0     Order Specific Question:   Supervising Provider    Answer:   Beatrice Lecher D [[id number]]   DISCONTD: amphetamine-dextroamphetamine (ADDERALL) 20 MG tablet    Sig: Take 1 tablet (20 mg total) by mouth 3 (three) times daily as needed.    Dispense:  90 tablet    Refill:  0    Fill on or after 07/15/2021    Order Specific Question:   Supervising Provider    Answer:   Beatrice Lecher D [[id number]]   amphetamine-dextroamphetamine (ADDERALL) 20 MG tablet    Sig: Take 1 tablet (20 mg total) by mouth 3 (three) times daily as needed.    Dispense:  90 tablet    Refill:  0    Fill on or after 08/13/2021    Order Specific Question:   Supervising Provider    Answer:   Beatrice Lecher D [[id number]]    Follow-up: Return in about 3 months (around 09/17/2021) for health maintenance exam - will need FBW at time of visit .    Ronnell Freshwater, NP

## 2021-06-18 ENCOUNTER — Other Ambulatory Visit: Payer: Self-pay | Admitting: Nurse Practitioner

## 2021-06-18 DIAGNOSIS — G4489 Other headache syndrome: Secondary | ICD-10-CM

## 2021-06-24 ENCOUNTER — Encounter: Payer: Self-pay | Admitting: Nurse Practitioner

## 2021-08-19 ENCOUNTER — Other Ambulatory Visit: Payer: Self-pay | Admitting: Nurse Practitioner

## 2021-08-19 ENCOUNTER — Encounter: Payer: Self-pay | Admitting: Nurse Practitioner

## 2021-08-19 DIAGNOSIS — F988 Other specified behavioral and emotional disorders with onset usually occurring in childhood and adolescence: Secondary | ICD-10-CM

## 2021-08-19 DIAGNOSIS — F411 Generalized anxiety disorder: Secondary | ICD-10-CM

## 2021-08-19 MED ORDER — ALPRAZOLAM 0.25 MG PO TABS: 0.2500 mg | ORAL_TABLET | Freq: Every evening | ORAL | 0 refills | Status: DC | PRN

## 2021-08-19 NOTE — Progress Notes (Signed)
Single prescription for #30 tablets sent to pharmacy

## 2021-08-19 NOTE — Telephone Encounter (Addendum)
Patient last seen 06/17/21 for ADHD/Fatigue. Advised to follow up in 3 months.   Last refill given 03/12/21 for 30 tabs with 1 refill.   No controlled substance contract on file. AS, CMA

## 2021-09-09 ENCOUNTER — Other Ambulatory Visit: Payer: Self-pay

## 2021-09-09 DIAGNOSIS — R5383 Other fatigue: Secondary | ICD-10-CM

## 2021-09-09 DIAGNOSIS — Z0000 Encounter for general adult medical examination without abnormal findings: Secondary | ICD-10-CM

## 2021-09-10 ENCOUNTER — Other Ambulatory Visit: Payer: 59

## 2021-09-10 ENCOUNTER — Other Ambulatory Visit: Payer: Self-pay

## 2021-09-10 DIAGNOSIS — R5383 Other fatigue: Secondary | ICD-10-CM

## 2021-09-10 DIAGNOSIS — Z0000 Encounter for general adult medical examination without abnormal findings: Secondary | ICD-10-CM

## 2021-09-11 LAB — COMPREHENSIVE METABOLIC PANEL
ALT: 10 IU/L (ref 0–32)
AST: 15 IU/L (ref 0–40)
Albumin/Globulin Ratio: 2 (ref 1.2–2.2)
Albumin: 4.9 g/dL — ABNORMAL HIGH (ref 3.8–4.8)
Alkaline Phosphatase: 48 IU/L (ref 44–121)
BUN/Creatinine Ratio: 10 (ref 9–23)
BUN: 8 mg/dL (ref 6–20)
Bilirubin Total: 0.4 mg/dL (ref 0.0–1.2)
CO2: 21 mmol/L (ref 20–29)
Calcium: 9.9 mg/dL (ref 8.7–10.2)
Chloride: 104 mmol/L (ref 96–106)
Creatinine, Ser: 0.81 mg/dL (ref 0.57–1.00)
Globulin, Total: 2.5 g/dL (ref 1.5–4.5)
Glucose: 75 mg/dL (ref 65–99)
Potassium: 5.6 mmol/L — ABNORMAL HIGH (ref 3.5–5.2)
Sodium: 142 mmol/L (ref 134–144)
Total Protein: 7.4 g/dL (ref 6.0–8.5)
eGFR: 97 mL/min/{1.73_m2} (ref >59)

## 2021-09-11 LAB — LIPID PANEL
Chol/HDL Ratio: 1.9 ratio (ref 0.0–4.4)
Cholesterol, Total: 196 mg/dL (ref 100–199)
HDL: 101 mg/dL (ref >39)
LDL Chol Calc (NIH): 84 mg/dL (ref 0–99)
Triglycerides: 61 mg/dL (ref 0–149)
VLDL Cholesterol Cal: 11 mg/dL (ref 5–40)

## 2021-09-11 LAB — CBC
Hematocrit: 43.3 % (ref 34.0–46.6)
Hemoglobin: 14.7 g/dL (ref 11.1–15.9)
MCH: 31.4 pg (ref 26.6–33.0)
MCHC: 33.9 g/dL (ref 31.5–35.7)
MCV: 93 fL (ref 79–97)
Platelets: 291 10*3/uL (ref 150–450)
RBC: 4.68 x10E6/uL (ref 3.77–5.28)
RDW: 11.6 % — ABNORMAL LOW (ref 11.7–15.4)
WBC: 5.3 10*3/uL (ref 3.4–10.8)

## 2021-09-11 LAB — TSH: TSH: 2.91 u[IU]/mL (ref 0.450–4.500)

## 2021-09-11 LAB — HEMOGLOBIN A1C
Est. average glucose Bld gHb Est-mCnc: 97 mg/dL
Hgb A1c MFr Bld: 5 % (ref 4.8–5.6)

## 2021-09-11 NOTE — Progress Notes (Signed)
Labs look good. K 5.6. discuss results with patient during appointment for CPE

## 2021-09-17 ENCOUNTER — Encounter: Payer: Self-pay | Admitting: Nurse Practitioner

## 2021-09-17 ENCOUNTER — Other Ambulatory Visit: Payer: Self-pay

## 2021-09-17 ENCOUNTER — Ambulatory Visit (INDEPENDENT_AMBULATORY_CARE_PROVIDER_SITE_OTHER): Payer: 59 | Admitting: Nurse Practitioner

## 2021-09-17 VITALS — BP 116/75 | HR 68 | Temp 98.5°F | Ht 64.0 in | Wt 128.2 lb

## 2021-09-17 DIAGNOSIS — N6320 Unspecified lump in the left breast, unspecified quadrant: Secondary | ICD-10-CM

## 2021-09-17 DIAGNOSIS — F411 Generalized anxiety disorder: Secondary | ICD-10-CM

## 2021-09-17 DIAGNOSIS — N6321 Unspecified lump in the left breast, upper outer quadrant: Secondary | ICD-10-CM | POA: Diagnosis not present

## 2021-09-17 DIAGNOSIS — F988 Other specified behavioral and emotional disorders with onset usually occurring in childhood and adolescence: Secondary | ICD-10-CM

## 2021-09-17 DIAGNOSIS — E875 Hyperkalemia: Secondary | ICD-10-CM

## 2021-09-17 DIAGNOSIS — Z0001 Encounter for general adult medical examination with abnormal findings: Secondary | ICD-10-CM

## 2021-09-17 MED ORDER — AMPHETAMINE-DEXTROAMPHETAMINE 20 MG PO TABS: 20.0000 mg | ORAL_TABLET | Freq: Three times a day (TID) | ORAL | 0 refills | Status: DC | PRN

## 2021-09-17 MED ORDER — ALPRAZOLAM 0.25 MG PO TABS: 0.2500 mg | ORAL_TABLET | Freq: Every evening | ORAL | 2 refills | Status: DC | PRN

## 2021-09-17 NOTE — Progress Notes (Signed)
Established Patient Office Visit  Subjective:  Patient ID: Tracey Watson, female    DOB: 05-May-1986  Age: 35 y.o. MRN: [id number]  CC:  Chief Complaint  Patient presents with   Annual Exam    HPI Tracey Watson presents for annual wellness visit. She states that she is doing well overall. Recently made unexpected move to new residence. Overwhelmed with move and working her full time job. Is single mother of young daughter. She states that she is taking her alprazolam 0.73m at night a little more often to help relax her mind and get a better night's sleep. She also takes adderall 25mup to three times daily as needed. She states that dosing is good. She does not have negative side effects from taking this medication.  Reviewed her PDMP profile.  Her overdose was score is 000.  Her fill history is appropriate.  She has a signed controlled [facility] This was posted in her chart on 09/17/2021. She did have routine, fasting labs done prior to this visit. Potassium level was 5.6 and labs were otherwise with within normal limits. She did have labs done 02/2021 and potassium level was normal at that time.  States that menstrual cycle is still irregular. Flow is spotty and light but does have cramping and premenstrual symptoms that come along with a menstrual cycle every month.  She has no other concerns or complaints. She denies chest pain, chest pressure, or shortness of breath. She denies headaches or visual disturbances. She denies abdominal pain, nausea, vomiting, or changes in bowel or bladder habits.   Patient has no other physical concerns or complaints today.  She denies chest pain, chest pressure, or shortness of breath. She denies headaches or visual disturbances. She denies abdominal pain, nausea, vomiting, or changes in bowel or bladder habits.    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   Ectopic pregnancy 08/11/2012   Headache    migraines   History of kidney stones    h/o   Hot flashes    Irregular heart beat     Past Surgical History:  Procedure Laterality Date   APPENDECTOMY     COLPOSCOPY  2003   COMBINED HYSTEROSCOPY DIAGNOSTIC / D&C  08/11/2012   HYSTEROSCOPY WITH D & C  04/07/2018   Procedure: DILATATION AND CURETTAGE /HYSTEROSCOPY;  Surgeon: StMalachy MoodMD;  Location: [facility] OR[facility];  Service: Gynecology;;   IUD REMOVAL N/A 04/07/2018   Procedure: INTRAUTERINE DEVICE (IUD) REMOVAL;  Surgeon: StMalachy MoodMD;  Location: [facility] OR[facility];  Service: Gynecology;  Laterality: N/A;   LAPAROSCOPIC UNILATERAL SALPINGECTOMY Left 04/07/2018   Procedure: LAPAROSCOPIC LEFT SALPINGECTOMY;  Surgeon: StMalachy MoodMD;  Location: [facility] OR[facility];  Service: Gynecology;  Laterality: Left;   LAPAROSCOPY  08/11/2012, 08/2012   Right salpingostomy Ectopic pregnancy by [place], 08/2012 Right Salpingectomy remaining ectopic   MELANOMA EXCISION     SALPINGECTOMY  08/2012    History reviewed. No pertinent family history.  Social History   Socioeconomic History   Marital status: Single    Spouse name: Not on file   Number of children: 1   Years of education: Not on file   Highest education level: High school graduate  Occupational History   Not on file  Tobacco Use   Smoking status: Every Day    Packs/day:  0.25    Years: 17.00    Pack years: 4.25    Types: E-cigarettes, Cigarettes   Smokeless tobacco: Never  Vaping Use   Vaping Use: Not on file  Substance and Sexual Activity   Alcohol use: Yes    Comment: Socially   Drug use: No   Sexual activity: Yes    Partners: Male    Birth control/protection: Surgical    Comment: Salpingectomy   Other Topics Concern   Not on file  Social History Narrative   Tracey Watson, ex - boyfriend.    Social Determinants of Health   Financial Resource Strain: Not on file  Food Insecurity: Not on  file  Transportation Needs: Not on file  Physical Activity: Not on file  Stress: Not on file  Social Connections: Not on file  Intimate Partner Violence: Not on file    Outpatient Medications Prior to Visit  Medication Sig Dispense Refill   ibuprofen (ADVIL) 800 MG tablet TAKE 1 TABLET BY MOUTH EVERY 6 HOURS AS NEEDED 90 tablet 1   loratadine (CLARITIN) 10 MG tablet Take by mouth.     nystatin (MYCOSTATIN) 100000 UNIT/ML suspension Swish and swallow with 60ms QID 500 mL 1   scopolamine (TRANSDERM-SCOP) 1 MG/3DAYS Place 1 patch (1.5 mg total) onto the skin every 3 (three) days. 12 each 12   valACYclovir (VALTREX) 1000 MG tablet TAKE ONE TABLET BY MOUTH DAILY FOR FIVE DAYS 5 tablet 6   valACYclovir (VALTREX) 1000 MG tablet Take 1 tablet (1,000 mg total) by mouth 2 (two) times daily. 14 tablet 2   ALPRAZolam (XANAX) 0.25 MG tablet Take 1 tablet (0.25 mg total) by mouth at bedtime as needed for anxiety. 30 tablet 0   amphetamine-dextroamphetamine (ADDERALL) 20 MG tablet Take 1 tablet (20 mg total) by mouth 3 (three) times daily as needed. 90 tablet 0   No facility-administered medications prior to visit.    No Known Allergies  ROS Review of Systems  Constitutional:  Positive for fatigue. Negative for activity change, appetite change, chills and fever.  HENT:  Negative for congestion, postnasal drip, rhinorrhea, sinus pressure, sinus pain, sneezing and sore throat.   Eyes: Negative.   Respiratory:  Negative for cough, chest tightness, shortness of breath and wheezing.   Cardiovascular:  Negative for chest pain and palpitations.  Gastrointestinal:  Negative for abdominal pain, constipation, diarrhea, nausea and vomiting.  Endocrine: Negative for cold intolerance, heat intolerance, polydipsia and polyuria.  Genitourinary:  Positive for menstrual problem. Negative for dyspareunia, dysuria, flank pain, frequency and urgency.       Irregular menstrual periods.  Reports cycles is light and  spotty.  Musculoskeletal:  Negative for arthralgias, back pain and myalgias.  Skin:  Negative for rash.  Allergic/Immunologic: Negative for environmental allergies.  Neurological:  Negative for dizziness, weakness and headaches.  Hematological:  Negative for adenopathy.  Psychiatric/Behavioral:  Positive for decreased concentration. The patient is nervous/anxious.      Objective:    Physical Exam Vitals and nursing note reviewed.  Constitutional:      Appearance: Normal appearance. She is well-developed.  HENT:     Head: Normocephalic and atraumatic.     Right Ear: Tympanic membrane, ear canal and external ear normal.     Left Ear: Tympanic membrane, ear canal and external ear normal.     Nose: Nose normal.     Mouth/Throat:     Mouth: Mucous membranes are moist.     Pharynx: Oropharynx is clear.  Eyes:  Extraocular Movements: Extraocular movements intact.     Conjunctiva/sclera: Conjunctivae normal.     Pupils: Pupils are equal, round, and reactive to light.  Cardiovascular:     Rate and Rhythm: Normal rate and regular rhythm.     Pulses: Normal pulses.     Heart sounds: Normal heart sounds.  Pulmonary:     Effort: Pulmonary effort is normal.     Breath sounds: Normal breath sounds.  Chest:  Breasts:    Right: Normal. No swelling, bleeding, inverted nipple, mass, nipple discharge or skin change.     Left: Normal. No swelling, bleeding, inverted nipple, mass, nipple discharge, skin change or tenderness.    Abdominal:     General: Bowel sounds are normal.     Palpations: Abdomen is soft.     Tenderness: There is no abdominal tenderness.  Musculoskeletal:        General: Normal range of motion.     Cervical back: Normal range of motion and neck supple.  Lymphadenopathy:     Cervical: No cervical adenopathy.     Upper Body:     Right upper body: No axillary adenopathy.     Left upper body: No axillary adenopathy.  Skin:    General: Skin is warm and dry.      Capillary Refill: Capillary refill takes less than 2 seconds.  Neurological:     General: No focal deficit present.     Mental Status: She is alert and oriented to person, place, and time.  Psychiatric:        Mood and Affect: Mood normal.        Behavior: Behavior normal.        Thought Content: Thought content normal.        Judgment: Judgment normal.    Today's Vitals   09/17/21 1552  BP: 116/75  Pulse: 68  Temp: 98.5 F (36.9 C)  SpO2: 100%  Weight: 128 lb 3.2 oz (58.2 kg)  Height: '5\' 4"'  (1.626 m)   Body mass index is 22.01 kg/m.   Wt Readings from Last 3 Encounters:  09/17/21 128 lb 3.2 oz (58.2 kg)  06/17/21 126 lb 6.4 oz (57.3 kg)  02/25/21 127 lb 8 oz (57.8 kg)     Health Maintenance Due  Topic Date Due   COVID-19 Vaccine (3 - Pfizer risk series) 05/23/2021   INFLUENZA VACCINE  Never done    There are no preventive care reminders to display for this patient.  Lab Results  Component Value Date   TSH 2.910 09/10/2021   Lab Results  Component Value Date   WBC 5.3 09/10/2021   HGB 14.7 09/10/2021   HCT 43.3 09/10/2021   MCV 93 09/10/2021   PLT 291 09/10/2021   Lab Results  Component Value Date   NA 142 09/10/2021   K 5.6 (H) 09/10/2021   CO2 21 09/10/2021   GLUCOSE 75 09/10/2021   BUN 8 09/10/2021   CREATININE 0.81 09/10/2021   BILITOT 0.4 09/10/2021   ALKPHOS 48 09/10/2021   AST 15 09/10/2021   ALT 10 09/10/2021   PROT 7.4 09/10/2021   ALBUMIN 4.9 (H) 09/10/2021   CALCIUM 9.9 09/10/2021   ANIONGAP 8 10/10/2019   EGFR 97 09/10/2021   Lab Results  Component Value Date   CHOL 196 09/10/2021   Lab Results  Component Value Date   HDL 101 09/10/2021   Lab Results  Component Value Date   LDLCALC 84 09/10/2021   Lab Results  Component  Value Date   TRIG 61 09/10/2021   Lab Results  Component Value Date   CHOLHDL 1.9 09/10/2021   Lab Results  Component Value Date   HGBA1C 5.0 09/10/2021      Assessment & Plan:  1. Encounter  for general adult medical examination with abnormal findings Annual wellness visit today.  2. Mass of upper outer quadrant of left breast Small mass palpated in upper outer quadrant of the left breast.  We will get ultrasound of the area for further evaluation.  We will follow-up as indicated. - US BREAST LTD UNI LEFT INC AXILLA; Future  3. Generalized anxiety disorder Patient may continue to take alprazolam 0.25 mg tablets at bedtime as needed for anxiety causing insomnia.  A new prescription was sent to her pharmacy today. - ALPRAZolam (XANAX) 0.25 MG tablet; Take 1 tablet (0.25 mg total) by mouth at bedtime as needed for anxiety.  Dispense: 30 tablet; Refill: 2  4. Attention deficit disorder (ADD) in adult Continue Adderall 20 mg tablets up to 3 times daily as needed for focus and concentration.  Reviewed her PDMP profile today.  She signed controlled substances agreement for [place] primary care at [facility]. Three 30-day prescription sent to her pharmacy.  Dates are 09/17/2021, 10/15/2021, and 11/13/2021.  5. Hyperkalemia Will redraw BMP at next visit.  Problem List Items Addressed This Visit       Other   Generalized anxiety disorder   Relevant Medications   ALPRAZolam (XANAX) 0.25 MG tablet   Attention deficit disorder (ADD) in adult   Encounter for general adult medical examination with abnormal findings - Primary   Mass of upper outer quadrant of left breast   Relevant Orders   US BREAST LTD UNI LEFT INC AXILLA   Hyperkalemia    Meds ordered this encounter  Medications   ALPRAZolam (XANAX) 0.25 MG tablet    Sig: Take 1 tablet (0.25 mg total) by mouth at bedtime as needed for anxiety.    Dispense:  30 tablet    Refill:  2    Order Specific Question:   Supervising Provider    Answer:   Beatrice Lecher D [[id number]]   DISCONTD: amphetamine-dextroamphetamine (ADDERALL) 20 MG tablet    Sig: Take 1 tablet (20 mg total) by mouth 3 (three) times daily as needed.     Dispense:  90 tablet    Refill:  0    Order Specific Question:   Supervising Provider    Answer:   Beatrice Lecher D [[id number]]   This note was dictated using Dragon Voice Recognition Software. Rapid proofreading was performed to expedite the delivery of the information. Despite proofreading, phonetic errors will occur which are common with this voice recognition software. Please take this into consideration. If there are any concerns, please contact our office.    Follow-up: Return in about 3 months (around 12/17/2021) for ADD.    Ronnell Freshwater, NP

## 2021-09-18 ENCOUNTER — Ambulatory Visit: Payer: 59 | Admitting: Physician Assistant

## 2021-09-23 ENCOUNTER — Other Ambulatory Visit: Payer: Self-pay | Admitting: Nurse Practitioner

## 2021-09-23 ENCOUNTER — Encounter: Payer: Self-pay | Admitting: Nurse Practitioner

## 2021-09-23 DIAGNOSIS — F988 Other specified behavioral and emotional disorders with onset usually occurring in childhood and adolescence: Secondary | ICD-10-CM

## 2021-09-23 DIAGNOSIS — N6320 Unspecified lump in the left breast, unspecified quadrant: Secondary | ICD-10-CM

## 2021-09-23 MED ORDER — AMPHETAMINE-DEXTROAMPHETAMINE 20 MG PO TABS: 20.0000 mg | ORAL_TABLET | Freq: Three times a day (TID) | ORAL | 0 refills | Status: DC | PRN

## 2021-09-23 NOTE — Telephone Encounter (Signed)
Tracey Watson,  Please see previous My Chart message.  Can you send the prescription to [facility].  Patient is going out of town.

## 2021-09-25 ENCOUNTER — Other Ambulatory Visit: Payer: Self-pay | Admitting: Nurse Practitioner

## 2021-09-25 DIAGNOSIS — F988 Other specified behavioral and emotional disorders with onset usually occurring in childhood and adolescence: Secondary | ICD-10-CM

## 2021-09-25 MED ORDER — AMPHETAMINE-DEXTROAMPHETAMINE 20 MG PO TABS: 20.0000 mg | ORAL_TABLET | Freq: Three times a day (TID) | ORAL | 0 refills | Status: DC | PRN

## 2021-09-25 NOTE — Progress Notes (Signed)
Rx for adderall had already been sent to [facility]. Sent new prescription asking they disregard my initial prescription.

## 2021-09-25 NOTE — Progress Notes (Signed)
Sent single 30 day prescription for adderall to [facility] as her usual pharmacy is out of stock of adderall

## 2021-09-28 DIAGNOSIS — N6321 Unspecified lump in the left breast, upper outer quadrant: Secondary | ICD-10-CM | POA: Insufficient documentation

## 2021-09-28 DIAGNOSIS — N6320 Unspecified lump in the left breast, unspecified quadrant: Secondary | ICD-10-CM | POA: Insufficient documentation

## 2021-09-28 DIAGNOSIS — E875 Hyperkalemia: Secondary | ICD-10-CM | POA: Insufficient documentation

## 2021-09-30 ENCOUNTER — Encounter: Payer: Self-pay | Admitting: Physician Assistant

## 2021-09-30 ENCOUNTER — Ambulatory Visit: Payer: Commercial Managed Care - PPO | Admitting: Physician Assistant

## 2021-09-30 ENCOUNTER — Other Ambulatory Visit: Payer: Self-pay

## 2021-09-30 DIAGNOSIS — Z85828 Personal history of other malignant neoplasm of skin: Secondary | ICD-10-CM | POA: Diagnosis not present

## 2021-09-30 DIAGNOSIS — Z85820 Personal history of malignant melanoma of skin: Secondary | ICD-10-CM

## 2021-09-30 DIAGNOSIS — Z808 Family history of malignant neoplasm of other organs or systems: Secondary | ICD-10-CM

## 2021-09-30 DIAGNOSIS — D2272 Melanocytic nevi of left lower limb, including hip: Secondary | ICD-10-CM | POA: Diagnosis not present

## 2021-09-30 DIAGNOSIS — D485 Neoplasm of uncertain behavior of skin: Secondary | ICD-10-CM

## 2021-09-30 DIAGNOSIS — D225 Melanocytic nevi of trunk: Secondary | ICD-10-CM | POA: Diagnosis not present

## 2021-09-30 NOTE — Patient Instructions (Signed)

## 2021-10-01 ENCOUNTER — Encounter: Payer: Self-pay | Admitting: Physician Assistant

## 2021-10-01 NOTE — Progress Notes (Signed)
New Patient   Subjective  Tracey Watson is a 35 y.o. female who presents for the following: New Patient (Initial Visit) (Patient here today for skin check. 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 Per patient she also has a history of non mole skin cancer. Per patient family history of non mole skin cancer. No family history of melanoma. ). She has a significant tanning bed history. She has discontinued tanning bed and uses spray on tan.    The following portions of the chart were reviewed this encounter and updated as appropriate:  Tobacco  Allergies  Meds  Problems  Med Hx  Surg Hx  Fam Hx      Objective  Well appearing patient in no apparent distress; mood and affect are within normal limits.  A full examination was performed including scalp, head, eyes, ears, nose, lips, neck, chest, axillae, abdomen, back, buttocks, bilateral upper extremities, bilateral lower extremities, hands, feet, fingers, toes, fingernails, and toenails. All findings within normal limits unless otherwise noted below.  Left Thigh - Anterior Bichromic dark nested macule.   Right Inframammary Fold Bichromic dark nested macule.        Left Breast Bichromic dark nested macule.             Assessment & Plan  Neoplasm of uncertain behavior of skin (3) Left Thigh - Anterior  Skin / nail biopsy Type of biopsy: tangential   Informed consent: discussed and consent obtained   Timeout: patient name, date of birth, surgical site, and procedure verified   Procedure prep:  Patient was prepped and draped in usual sterile fashion (Non sterile) Prep type:  Chlorhexidine Anesthesia: the lesion was anesthetized in a standard fashion   Anesthetic:  1% lidocaine w/ epinephrine 1-100,000 local infiltration Instrument used: flexible razor blade   Outcome: patient tolerated procedure well   Post-procedure details: wound care  instructions given    Specimen 1 - Surgical pathology Differential Diagnosis: r/o atypia  Check Margins: yes  Right Inframammary Fold  Skin / nail biopsy Type of biopsy: tangential   Informed consent: discussed and consent obtained   Timeout: patient name, date of birth, surgical site, and procedure verified   Procedure prep:  Patient was prepped and draped in usual sterile fashion (Non sterile) Prep type:  Chlorhexidine Anesthesia: the lesion was anesthetized in a standard fashion   Anesthetic:  1% lidocaine w/ epinephrine 1-100,000 local infiltration Instrument used: flexible razor blade   Outcome: patient tolerated procedure well   Post-procedure details: wound care instructions given    Specimen 2 - Surgical pathology Differential Diagnosis: r/o atypia  Check Margins: yes  Left Breast  Skin / nail biopsy Type of biopsy: tangential   Informed consent: discussed and consent obtained   Timeout: patient name, date of birth, surgical site, and procedure verified   Procedure prep:  Patient was prepped and draped in usual sterile fashion (Non sterile) Prep type:  Chlorhexidine Anesthesia: the lesion was anesthetized in a standard fashion   Anesthetic:  1% lidocaine w/ epinephrine 1-100,000 local infiltration Instrument used: flexible razor blade   Outcome: patient tolerated procedure well   Post-procedure details: wound care instructions given    Specimen 3 - Surgical pathology Differential Diagnosis: r/o atypia  Check Margins: yes     I, Fatih Stalvey, PA-C, have reviewed all documentation's for this visit.  The documentation on 10/01/21 for the exam, diagnosis, procedures  and orders are all accurate and complete.

## 2021-10-14 ENCOUNTER — Encounter: Payer: Self-pay | Admitting: Physician Assistant

## 2021-10-20 ENCOUNTER — Encounter: Payer: Self-pay | Admitting: Nurse Practitioner

## 2021-10-22 ENCOUNTER — Other Ambulatory Visit: Payer: Self-pay | Admitting: Nurse Practitioner

## 2021-10-22 DIAGNOSIS — F988 Other specified behavioral and emotional disorders with onset usually occurring in childhood and adolescence: Secondary | ICD-10-CM

## 2021-10-22 MED ORDER — AMPHETAMINE-DEXTROAMPHETAMINE 20 MG PO TABS: 20.0000 mg | ORAL_TABLET | Freq: Three times a day (TID) | ORAL | 0 refills | Status: DC | PRN

## 2021-10-29 ENCOUNTER — Other Ambulatory Visit: Payer: Self-pay | Admitting: Nurse Practitioner

## 2021-10-29 ENCOUNTER — Ambulatory Visit
Admission: RE | Admit: 2021-10-29 | Discharge: 2021-10-29 | Disposition: A | Discharge status: Home / Self Care | Payer: Commercial Managed Care - PPO | Source: Ambulatory Visit | Attending: Nurse Practitioner | Admitting: Nurse Practitioner

## 2021-10-29 ENCOUNTER — Ambulatory Visit
Admission: RE | Admit: 2021-10-29 | Discharge: 2021-10-29 | Disposition: A | Discharge status: Home / Self Care | Payer: 59 | Source: Ambulatory Visit | Attending: Nurse Practitioner | Admitting: Nurse Practitioner

## 2021-10-29 ENCOUNTER — Other Ambulatory Visit: Payer: Self-pay

## 2021-10-29 DIAGNOSIS — N6321 Unspecified lump in the left breast, upper outer quadrant: Secondary | ICD-10-CM

## 2021-10-29 DIAGNOSIS — N6320 Unspecified lump in the left breast, unspecified quadrant: Secondary | ICD-10-CM

## 2021-10-29 DIAGNOSIS — R921 Mammographic calcification found on diagnostic imaging of breast: Secondary | ICD-10-CM

## 2021-10-30 NOTE — Progress Notes (Signed)
Benign ultrasound of breast.

## 2021-10-30 NOTE — Progress Notes (Signed)
Benign.

## 2021-11-19 ENCOUNTER — Other Ambulatory Visit: Payer: Self-pay | Admitting: Nurse Practitioner

## 2021-11-19 DIAGNOSIS — F988 Other specified behavioral and emotional disorders with onset usually occurring in childhood and adolescence: Secondary | ICD-10-CM

## 2021-12-10 ENCOUNTER — Other Ambulatory Visit: Payer: Self-pay | Admitting: Nurse Practitioner

## 2021-12-10 ENCOUNTER — Encounter: Payer: Self-pay | Admitting: Nurse Practitioner

## 2021-12-10 DIAGNOSIS — T3695XA Adverse effect of unspecified systemic antibiotic, initial encounter: Secondary | ICD-10-CM

## 2021-12-10 MED ORDER — FLUCONAZOLE 150 MG PO TABS: ORAL_TABLET | ORAL | 0 refills | Status: DC

## 2021-12-17 ENCOUNTER — Ambulatory Visit (INDEPENDENT_AMBULATORY_CARE_PROVIDER_SITE_OTHER): Payer: Commercial Managed Care - PPO | Admitting: Nurse Practitioner

## 2021-12-17 ENCOUNTER — Encounter: Payer: Self-pay | Admitting: Nurse Practitioner

## 2021-12-17 ENCOUNTER — Other Ambulatory Visit: Payer: Self-pay

## 2021-12-17 DIAGNOSIS — G4489 Other headache syndrome: Secondary | ICD-10-CM | POA: Diagnosis not present

## 2021-12-17 DIAGNOSIS — F988 Other specified behavioral and emotional disorders with onset usually occurring in childhood and adolescence: Secondary | ICD-10-CM | POA: Diagnosis not present

## 2021-12-17 MED ORDER — IBUPROFEN 800 MG PO TABS: 800.0000 mg | ORAL_TABLET | Freq: Four times a day (QID) | ORAL | 2 refills | Status: DC | PRN

## 2021-12-17 MED ORDER — AMPHETAMINE-DEXTROAMPHETAMINE 20 MG PO TABS: ORAL_TABLET | ORAL | 0 refills | Status: DC

## 2021-12-17 NOTE — Progress Notes (Signed)
Established Patient Office Visit  Subjective:  Patient ID: Tracey Watson, female    DOB: 06/30/1986  Age: 35 y.o. MRN: [id number]  CC:  Chief Complaint  Patient presents with   Follow-up    HPI Tracey Watson presents for follow up of ADD. She currently take adderall 7m up to three times daily when needed fr focus and concentration. A review of her PDMP profile shows overdose risk score of 080 and fill history is appropriate. She is due to have refills for this today. She does have a controlled substances agreement form which was signed on 09/23/2021.  She has not new concerns or complaints today.   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 SNancy Fetter   Ectopic pregnancy 08/11/2012   Headache    migraines   History of kidney stones    h/o   Hot flashes    Irregular heart beat    [place]   Upper Back (35yrold)    Past Surgical History:  Procedure Laterality Date   APPENDECTOMY     COLPOSCOPY  2003   COMBINED HYSTEROSCOPY DIAGNOSTIC / D&C  08/11/2012   HYSTEROSCOPY WITH D & C  04/07/2018   Procedure: DILATATION AND CURETTAGE /HYSTEROSCOPY;  Surgeon: StMalachy MoodMD;  Location: [facility];  Service: Gynecology;;   IUD REMOVAL N/A 04/07/2018   Procedure: INTRAUTERINE DEVICE (IUD) REMOVAL;  Surgeon: StMalachy MoodMD;  Location: [facility];  Service: Gynecology;  Laterality: N/A;   LAPAROSCOPIC UNILATERAL SALPINGECTOMY Left 04/07/2018   Procedure: LAPAROSCOPIC LEFT SALPINGECTOMY;  Surgeon: StMalachy MoodMD;  Location: [facility];  Service: Gynecology;  Laterality: Left;   LAPAROSCOPY  08/11/2012, 08/2012   Right salpingostomy  Ectopic pregnancy by [place], 08/2012 Right Salpingectomy remaining ectopic   MELANOMA EXCISION     SALPINGECTOMY  08/2012    History reviewed. No pertinent family history.  Social History   Socioeconomic History   Marital status: Single    Spouse name: Not on file   Number of children: 1   Years of education: Not on file   Highest education level: High school graduate  Occupational History   Not on file  Tobacco Use   Smoking status: Every Day    Packs/day: 0.25    Years: 17.00    Pack years: 4.25    Types: E-cigarettes, Cigarettes   Smokeless tobacco: Never  Vaping Use   Vaping Use: Not on file  Substance and Sexual Activity   Alcohol use: Yes    Comment: Socially   Drug use: No   Sexual activity: Yes    Partners: Male    Birth control/protection: Surgical    Comment: Salpingectomy   Other Topics Concern   Not on file  Social History Narrative   Scottie ralley, ex - boyfriend.    Social Determinants of Health   Financial Resource Strain: Not on file  Food Insecurity: Not on file  Transportation Needs: Not on file  Physical Activity: Not on file  Stress: Not on file  Social Connections: Not on file  Intimate Partner Violence: Not on file  Outpatient Medications Prior to Visit  Medication Sig Dispense Refill   ALPRAZolam (XANAX) 0.25 MG tablet Take 1 tablet (0.25 mg total) by mouth at bedtime as needed for anxiety. 30 tablet 2   fluconazole (DIFLUCAN) 150 MG tablet Take 1 tablet po once. May repeat dose in 3 days as needed for persistent symptoms. 3 tablet 0   loratadine (CLARITIN) 10 MG tablet Take by mouth.     nystatin (MYCOSTATIN) 100000 UNIT/ML suspension Swish and swallow with 50ms QID 500 mL 1   scopolamine (TRANSDERM-SCOP) 1 MG/3DAYS Place 1 patch (1.5 mg total) onto the skin every 3 (three) days. 12 each 12   valACYclovir (VALTREX) 1000 MG tablet TAKE ONE TABLET BY MOUTH DAILY FOR FIVE DAYS 5 tablet 6   valACYclovir (VALTREX) 1000 MG tablet Take 1  tablet (1,000 mg total) by mouth 2 (two) times daily. 14 tablet 2   amphetamine-dextroamphetamine (ADDERALL) 20 MG tablet TAKE 1 TABLET BY MOUTH 3 TIMES DAILY AS NEEDED. DNF UNTIL 11/30 90 tablet 0   ibuprofen (ADVIL) 800 MG tablet TAKE 1 TABLET BY MOUTH EVERY 6 HOURS AS NEEDED 90 tablet 1   No facility-administered medications prior to visit.    No Known Allergies  ROS Review of Systems  Constitutional:  Positive for fatigue. Negative for activity change, chills and unexpected weight change.  HENT:  Negative for congestion, postnasal drip, rhinorrhea, sinus pressure, sinus pain, sneezing and sore throat.   Eyes: Negative.  Negative for redness.  Respiratory:  Negative for cough, chest tightness, shortness of breath and wheezing.   Cardiovascular:  Negative for chest pain and palpitations.  Gastrointestinal:  Negative for abdominal pain, constipation, diarrhea and vomiting.  Endocrine: Negative for cold intolerance, heat intolerance, polydipsia and polyuria.  Genitourinary: Negative.  Negative for dysuria and frequency.  Musculoskeletal:  Negative for arthralgias, back pain, joint swelling and neck pain.  Skin:  Negative for rash.  Allergic/Immunologic: Negative.   Neurological:  Negative for dizziness, tremors, numbness and headaches.  Hematological:  Negative for adenopathy. Does not bruise/bleed easily.  Psychiatric/Behavioral:  Positive for decreased concentration and sleep disturbance. Negative for behavioral problems (Depression) and suicidal ideas. The patient is nervous/anxious.        Doing well with current medication as prescribed      Objective:    Physical Exam Vitals and nursing note reviewed.  Constitutional:      Appearance: Normal appearance. She is well-developed.  HENT:     Head: Normocephalic and atraumatic.     Nose: Nose normal.     Mouth/Throat:     Mouth: Mucous membranes are moist.  Eyes:     Extraocular Movements: Extraocular movements intact.      Conjunctiva/sclera: Conjunctivae normal.     Pupils: Pupils are equal, round, and reactive to light.  Cardiovascular:     Rate and Rhythm: Normal rate and regular rhythm.     Pulses: Normal pulses.     Heart sounds: Normal heart sounds.  Pulmonary:     Effort: Pulmonary effort is normal.     Breath sounds: Normal breath sounds.  Abdominal:     Palpations: Abdomen is soft.  Musculoskeletal:        General: Normal range of motion.     Cervical back: Normal range of motion and neck supple.  Lymphadenopathy:     Cervical: No cervical adenopathy.  Skin:    General: Skin is warm and dry.     Capillary Refill: Capillary refill takes less than 2 seconds.  Neurological:     General: No focal deficit present.     Mental Status: She is alert and oriented to person, place, and time.  Psychiatric:        Mood and Affect: Mood normal.        Behavior: Behavior normal.        Thought Content: Thought content normal.        Judgment: Judgment normal.    Today's Vitals   12/17/21 0932  BP: 108/73  Pulse: 73  Temp: 97.9 F (36.6 C)  SpO2: 98%  Weight: 128 lb (58.1 kg)  Height: '5\' 4"'  (1.626 m)   Body mass index is 21.97 kg/m.   Wt Readings from Last 3 Encounters:  12/17/21 128 lb (58.1 kg)  09/17/21 128 lb 3.2 oz (58.2 kg)  06/17/21 126 lb 6.4 oz (57.3 kg)     Health Maintenance Due  Topic Date Due   Pneumococcal Vaccine 38-67 Years old (1 - PCV) Never done   COVID-19 Vaccine (3 - Pfizer risk series) 05/23/2021    There are no preventive care reminders to display for this patient.  Lab Results  Component Value Date   TSH 2.910 09/10/2021   Lab Results  Component Value Date   WBC 5.3 09/10/2021   HGB 14.7 09/10/2021   HCT 43.3 09/10/2021   MCV 93 09/10/2021   PLT 291 09/10/2021   Lab Results  Component Value Date   NA 142 09/10/2021   K 5.6 (H) 09/10/2021   CO2 21 09/10/2021   GLUCOSE 75 09/10/2021   BUN 8 09/10/2021   CREATININE 0.81 09/10/2021   BILITOT 0.4  09/10/2021   ALKPHOS 48 09/10/2021   AST 15 09/10/2021   ALT 10 09/10/2021   PROT 7.4 09/10/2021   ALBUMIN 4.9 (H) 09/10/2021   CALCIUM 9.9 09/10/2021   ANIONGAP 8 10/10/2019   EGFR 97 09/10/2021   Lab Results  Component Value Date   CHOL 196 09/10/2021   Lab Results  Component Value Date   HDL 101 09/10/2021   Lab Results  Component Value Date   LDLCALC 84 09/10/2021   Lab Results  Component Value Date   TRIG 61 09/10/2021   Lab Results  Component Value Date   CHOLHDL 1.9 09/10/2021   Lab Results  Component Value Date   HGBA1C 5.0 09/10/2021      Assessment & Plan:  1. Headache syndrome May take ibuprofen 836m up to three times daily as needed. New prescription sent to new pharmacy today.  - ibuprofen (ADVIL) 800 MG tablet; Take 1 tablet (800 mg total) by mouth every 6 (six) hours as needed.  Dispense: 90 tablet; Refill: 2  2. Attention deficit disorder (ADD) in adult Patient doing well on current dose adderall. Three 30 day prescriptions were sent to her pharmacy. Dates are 12/17/2021, 01/15/2022, and 02/13/2022 - amphetamine-dextroamphetamine (ADDERALL) 20 MG tablet; TAKE 1 TABLET BY MOUTH 3 TIMES DAILY AS NEEDED. DNF UNTIL 11/30  Dispense: 90 tablet; Refill: 0   Problem List Items Addressed This Visit       Other   Attention deficit disorder (ADD) in adult   Relevant Medications   amphetamine-dextroamphetamine (ADDERALL) 20 MG tablet   Headache syndrome   Relevant Medications   ibuprofen (ADVIL) 800 MG tablet    Meds ordered this encounter  Medications   DISCONTD: amphetamine-dextroamphetamine (ADDERALL) 20 MG tablet    Sig: TAKE 1 TABLET BY MOUTH 3 TIMES DAILY AS NEEDED. DNF UNTIL 11/30  Dispense:  90 tablet    Refill:  0    Order Specific Question:   Supervising Provider    Answer:   Beatrice Lecher D [[id number]]   DISCONTD: amphetamine-dextroamphetamine (ADDERALL) 20 MG tablet    Sig: TAKE 1 TABLET BY MOUTH 3 TIMES DAILY AS NEEDED. DNF UNTIL  11/30    Dispense:  90 tablet    Refill:  0    [place] after 01/15/2022    Order Specific Question:   Supervising Provider    Answer:   Beatrice Lecher D [[id number]]   ibuprofen (ADVIL) 800 MG tablet    Sig: Take 1 tablet (800 mg total) by mouth every 6 (six) hours as needed.    Dispense:  90 tablet    Refill:  2    Order Specific Question:   Supervising Provider    Answer:   Beatrice Lecher D [[id number]]   amphetamine-dextroamphetamine (ADDERALL) 20 MG tablet    Sig: TAKE 1 TABLET BY MOUTH 3 TIMES DAILY AS NEEDED. DNF UNTIL 11/30    Dispense:  90 tablet    Refill:  0    Fil[place] after 02/13/2022    Order Specific Question:   Supervising Provider    Answer:   Beatrice Lecher D [[id number]]    Follow-up: Return in about 3 months (around 03/17/2022) for ADD.    Ronnell Freshwater, NP

## 2021-12-19 ENCOUNTER — Other Ambulatory Visit: Payer: Self-pay | Admitting: Nurse Practitioner

## 2021-12-19 DIAGNOSIS — F411 Generalized anxiety disorder: Secondary | ICD-10-CM

## 2022-03-17 ENCOUNTER — Other Ambulatory Visit: Payer: Self-pay

## 2022-03-17 ENCOUNTER — Encounter: Payer: Self-pay | Admitting: Nurse Practitioner

## 2022-03-17 ENCOUNTER — Ambulatory Visit (INDEPENDENT_AMBULATORY_CARE_PROVIDER_SITE_OTHER): Payer: Commercial Managed Care - PPO | Admitting: Nurse Practitioner

## 2022-03-17 VITALS — BP 124/78 | HR 61 | Temp 98.2°F | Ht 64.17 in | Wt 128.4 lb

## 2022-03-17 DIAGNOSIS — J301 Allergic rhinitis due to pollen: Secondary | ICD-10-CM | POA: Diagnosis not present

## 2022-03-17 DIAGNOSIS — F5105 Insomnia due to other mental disorder: Secondary | ICD-10-CM

## 2022-03-17 DIAGNOSIS — F988 Other specified behavioral and emotional disorders with onset usually occurring in childhood and adolescence: Secondary | ICD-10-CM | POA: Diagnosis not present

## 2022-03-17 DIAGNOSIS — F411 Generalized anxiety disorder: Secondary | ICD-10-CM | POA: Diagnosis not present

## 2022-03-17 DIAGNOSIS — F409 Phobic anxiety disorder, unspecified: Secondary | ICD-10-CM

## 2022-03-17 MED ORDER — FLUTICASONE PROPIONATE 50 MCG/ACT NA SUSP: 2.0000 | Freq: Every day | NASAL | 5 refills | Status: DC

## 2022-03-17 MED ORDER — AMPHETAMINE-DEXTROAMPHETAMINE 20 MG PO TABS: ORAL_TABLET | ORAL | 0 refills | Status: DC

## 2022-03-17 MED ORDER — TRAZODONE HCL 50 MG PO TABS: 25.0000 mg | ORAL_TABLET | Freq: Every evening | ORAL | 2 refills | Status: DC | PRN

## 2022-03-17 MED ORDER — ALPRAZOLAM 0.25 MG PO TABS: ORAL_TABLET | ORAL | 2 refills | Status: DC

## 2022-03-17 NOTE — Progress Notes (Signed)
Established patient visit ? ? ?Patient: Tracey Watson   DOB: [birth year]   36 y.o. Female  MRN: [id number] ?Visit Date: 03/17/2022 ? ? ?Chief Complaint  ?Patient presents with  ? Follow-up  ? ?Subjective  ?  ?HPI  ?Follow up of medication ?-takes adderall and alprazolam  ?--PDMP profile reviewed today - overdose risk score is 090. Last fill of adderall 02/14/2022. Last fill alprazolam 02/04/2022 with two refills remaining.  ?-controlled substance agreement signed 09/23/2021.  ?-has been having trouble sleeping. She has been taking alprazolam 0.'25mg'$  at bedtime if needed. States that this has not been working quite as well.  ?-has tried melatonin and tylenol PM which really did not help her sleep.  ?-does not want to depend on alprazolam to help her sleep. This is to help her with panic attacks and not sleeping and taking this to help her sleep, is keeping her from using it when needed for panic attacks  ? ?Medications: ?Outpatient Medications Prior to Visit  ?Medication Sig  ? fluconazole (DIFLUCAN) 150 MG tablet Take 1 tablet po once. May repeat dose in 3 days as needed for persistent symptoms.  ? ibuprofen (ADVIL) 800 MG tablet Take 1 tablet (800 mg total) by mouth every 6 (six) hours as needed.  ? loratadine (CLARITIN) 10 MG tablet Take by mouth.  ? nystatin (MYCOSTATIN) 100000 UNIT/ML suspension Swish and swallow with 23ms QID  ? scopolamine (TRANSDERM-SCOP) 1 MG/3DAYS Place 1 patch (1.5 mg total) onto the skin every 3 (three) days.  ? valACYclovir (VALTREX) 1000 MG tablet TAKE ONE TABLET BY MOUTH DAILY FOR FIVE DAYS  ? valACYclovir (VALTREX) 1000 MG tablet Take 1 tablet (1,000 mg total) by mouth 2 (two) times daily.  ? [DISCONTINUED] ALPRAZolam (XANAX) 0.25 MG tablet TAKE 1 TABLET BY MOUTH AT BEDTIME AS NEEDED FOR ANXIETY  ? [DISCONTINUED] amphetamine-dextroamphetamine (ADDERALL) 20 MG tablet TAKE 1 TABLET BY MOUTH 3 TIMES DAILY AS NEEDED. DNF UNTIL 11/30  ? ?No facility-administered medications prior to visit.   ? ? ?Review of Systems  ?Constitutional:  Positive for fatigue. Negative for activity change, appetite change, chills and fever.  ?HENT:  Negative for congestion, postnasal drip, rhinorrhea, sinus pressure, sinus pain, sneezing and sore throat.   ?Eyes: Negative.   ?Respiratory:  Negative for cough, chest tightness, shortness of breath and wheezing.   ?Cardiovascular:  Negative for chest pain and palpitations.  ?Gastrointestinal:  Negative for abdominal pain, constipation, diarrhea, nausea and vomiting.  ?Endocrine: Negative for cold intolerance, heat intolerance, polydipsia and polyuria.  ?Genitourinary:  Negative for dyspareunia, dysuria, flank pain, frequency and urgency.  ?Musculoskeletal:  Negative for arthralgias, back pain and myalgias.  ?Skin:  Negative for rash.  ?Allergic/Immunologic: Positive for environmental allergies.  ?Neurological:  Negative for dizziness, weakness and headaches.  ?Hematological:  Negative for adenopathy.  ?Psychiatric/Behavioral:  Positive for decreased concentration and sleep disturbance. The patient is nervous/anxious.   ? ? ? ? Objective  ?  ? ?Today's Vitals  ? 03/17/22 1523  ?BP: 124/78  ?Pulse: 61  ?Temp: 98.2 ?F (36.8 ?C)  ?SpO2: 99%  ?Weight: 128 lb 6.4 oz (58.2 kg)  ?Height: 5' 4.17" (1.63 m)  ? ?Body mass index is 21.92 kg/m?.  ? ?BP Readings from Last 3 Encounters:  ?03/17/22 124/78  ?12/17/21 108/73  ?09/17/21 116/75  ?  ?Wt Readings from Last 3 Encounters:  ?03/17/22 128 lb 6.4 oz (58.2 kg)  ?12/17/21 128 lb (58.1 kg)  ?09/17/21 128 lb 3.2 oz (58.2 kg)  ?  ?  Physical Exam ?Vitals and nursing note reviewed.  ?Constitutional:   ?   Appearance: Normal appearance. She is well-developed.  ?HENT:  ?   Head: Normocephalic and atraumatic.  ?   Right Ear: Tympanic membrane, ear canal and external ear normal.  ?   Left Ear: Tympanic membrane, ear canal and external ear normal.  ?   Nose: Congestion present.  ?   Mouth/Throat:  ?   Mouth: Mucous membranes are moist.  ?   Pharynx:  Oropharynx is clear.  ?Eyes:  ?   Extraocular Movements: Extraocular movements intact.  ?   Conjunctiva/sclera: Conjunctivae normal.  ?   Pupils: Pupils are equal, round, and reactive to light.  ?Cardiovascular:  ?   Rate and Rhythm: Normal rate and regular rhythm.  ?   Pulses: Normal pulses.  ?   Heart sounds: Normal heart sounds.  ?Pulmonary:  ?   Effort: Pulmonary effort is normal.  ?   Breath sounds: Normal breath sounds.  ?Abdominal:  ?   Palpations: Abdomen is soft.  ?Musculoskeletal:     ?   General: Normal range of motion.  ?   Cervical back: Normal range of motion and neck supple.  ?Lymphadenopathy:  ?   Cervical: No cervical adenopathy.  ?Skin: ?   General: Skin is warm and dry.  ?   Capillary Refill: Capillary refill takes less than 2 seconds.  ?Neurological:  ?   General: No focal deficit present.  ?   Mental Status: She is alert and oriented to person, place, and time.  ?Psychiatric:     ?   Mood and Affect: Mood normal.     ?   Behavior: Behavior normal.     ?   Thought Content: Thought content normal.     ?   Judgment: Judgment normal.  ?  ? ? Assessment & Plan  ?  ?1. Seasonal allergic rhinitis due to pollen ?Patient may use Flonase 2 sprays in both nostrils daily. ?- fluticasone (FLONASE) 50 MCG/ACT nasal spray; Place 2 sprays into both nostrils daily.  Dispense: 16 g; Refill: 5 ? ?2. Insomnia due to anxiety and fear ?Trial of trazodone 50 mg.  Patient may take 1/2 to 1 tablet at bedtime as needed to help with insomnia.  New prescription sent to her pharmacy today. ?- traZODone (DESYREL) 50 MG tablet; Take 0.5-1 tablets (25-50 mg total) by mouth at bedtime as needed for sleep.  Dispense: 30 tablet; Refill: 2 ? ?3. Generalized anxiety disorder ?Patient may continue to take alprazolam 0.25 mg tablets once daily if needed for acute anxiety.  New prescription was sent to her pharmacy today.  Recommend she try decreasing use of alprazolam pump at home schedule using trazodone.  Patient voiced  understanding. ?- ALPRAZolam (XANAX) 0.25 MG tablet; TAKE 1 TABLET BY MOUTH AT BEDTIME AS NEEDED FOR ANXIETY  Dispense: 30 tablet; Refill: 2 ? ?4. Attention deficit disorder (ADD) in adult ?Patient may continue Adderall 20 mg tablets up to 3 times daily if needed for focus and administration. Three 30-day prescriptions were sent to her pharmacy today.  Dates are 03/17/2022, 04/15/2022, and 05/13/2022 ?- amphetamine-dextroamphetamine (ADDERALL) 20 MG tablet; TAKE 1 TABLET BY MOUTH 3 TIMES DAILY AS NEEDED. DNF UNTIL 11/30  Dispense: 90 tablet; Refill: 0  ? ? ?Problem List Items Addressed This Visit   ? ?  ? Respiratory  ? Seasonal allergic rhinitis due to pollen - Primary  ? Relevant Medications  ? fluticasone (FLONASE) 50 MCG/ACT nasal  spray  ?  ? Other  ? Generalized anxiety disorder  ? Relevant Medications  ? traZODone (DESYREL) 50 MG tablet  ? ALPRAZolam (XANAX) 0.25 MG tablet  ? Attention deficit disorder (ADD) in adult  ? Relevant Medications  ? amphetamine-dextroamphetamine (ADDERALL) 20 MG tablet  ? Insomnia due to anxiety and fear  ? Relevant Medications  ? traZODone (DESYREL) 50 MG tablet  ?  ? ?Return in about 3 months (around 06/17/2022) for mood, ADD.  ?   ? ? ? ? ?Ronnell Freshwater, NP  ?[facility] ?[phone] (phone) ?[phone] (fax) ? ?[facility]  ?

## 2022-03-22 DIAGNOSIS — F409 Phobic anxiety disorder, unspecified: Secondary | ICD-10-CM | POA: Insufficient documentation

## 2022-03-22 DIAGNOSIS — J301 Allergic rhinitis due to pollen: Secondary | ICD-10-CM | POA: Insufficient documentation

## 2022-03-28 IMAGING — MG DIGITAL DIAGNOSTIC BILAT W/ TOMO W/ CAD
8 of 16 series · 8 of 40 positions shown · non-contrast
Comparison: None.

CLINICAL DATA: Palpable abnormality in the LEFT breast, noted on
recent physical exam.



[R MLO]
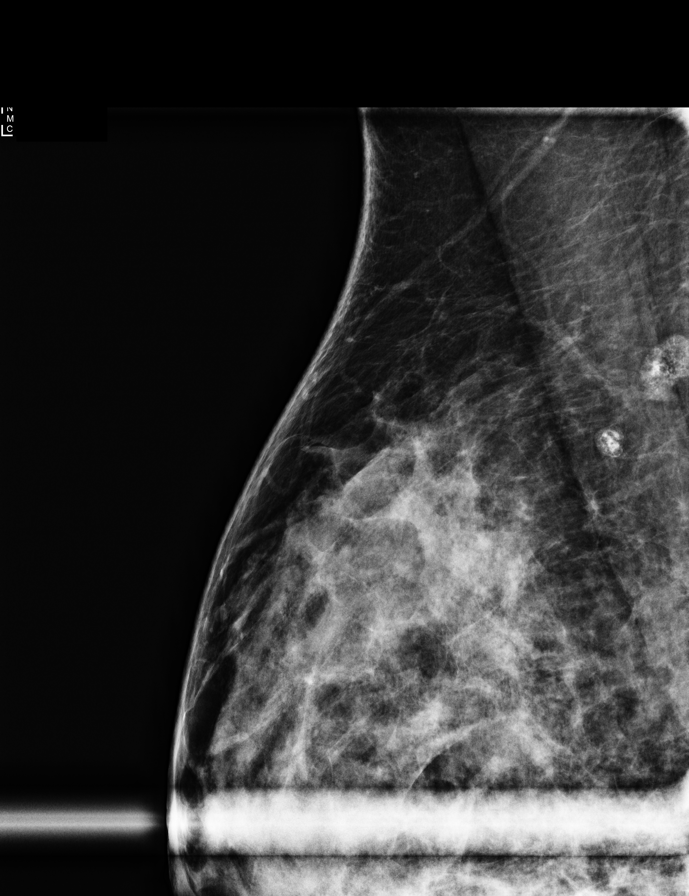

[R ML]
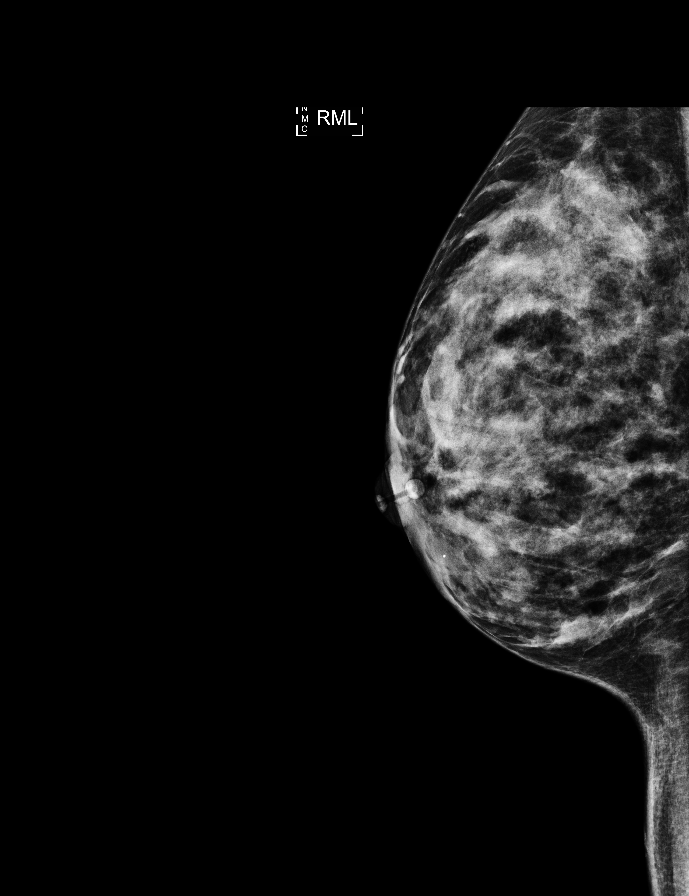

[R XCCL (1 of 2)]
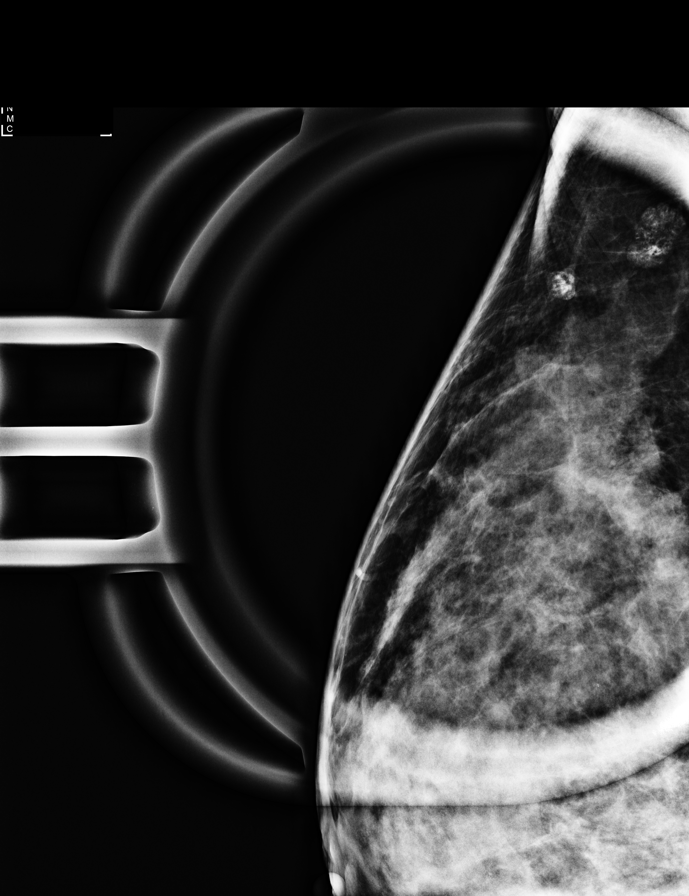

[R XCCL (2 of 2)]
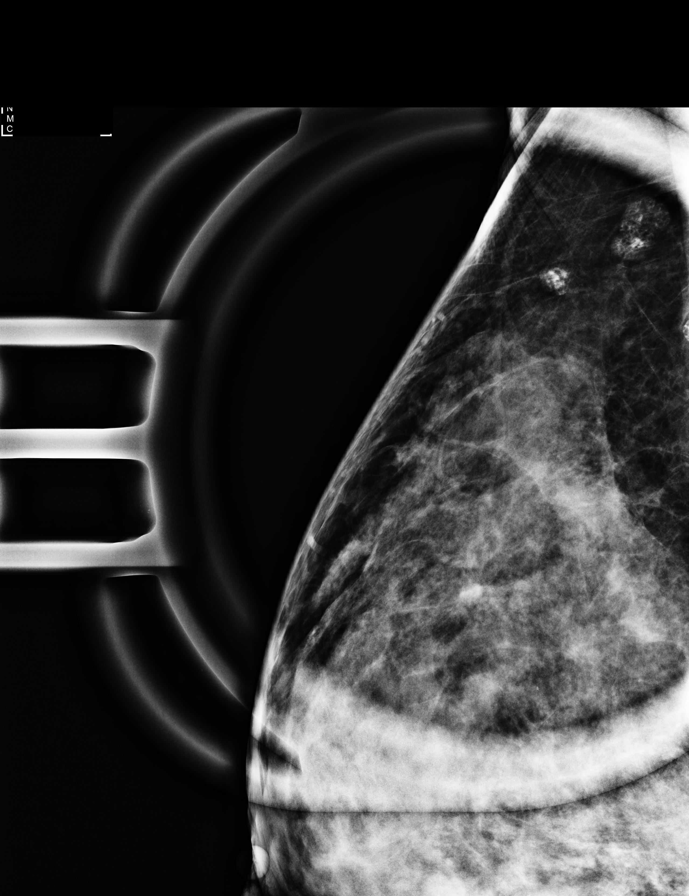

[L MLO synth-2D]
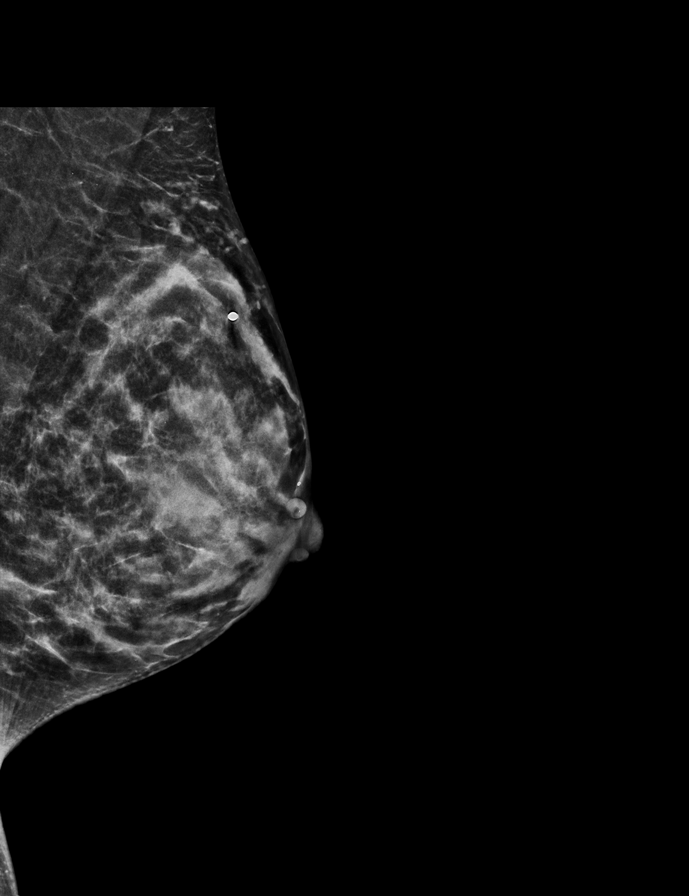

[R CC synth-2D]
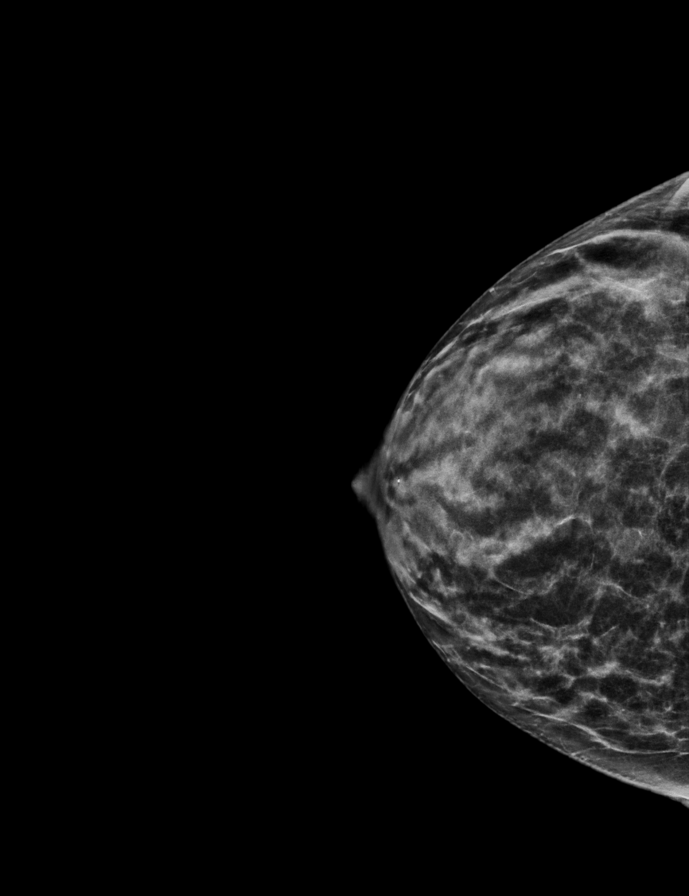

[R MLO synth-2D]
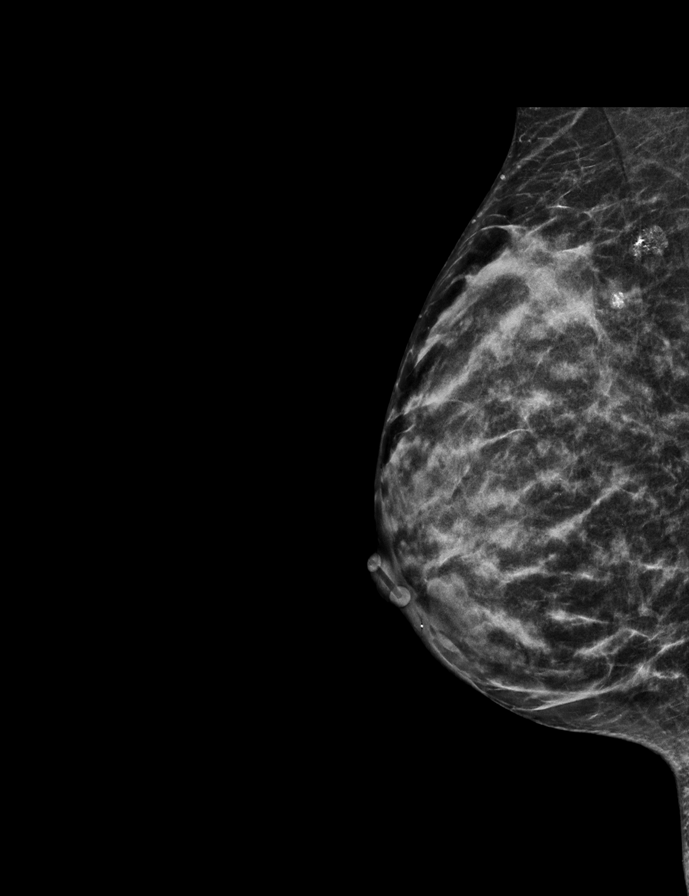

[L CC synth-2D]
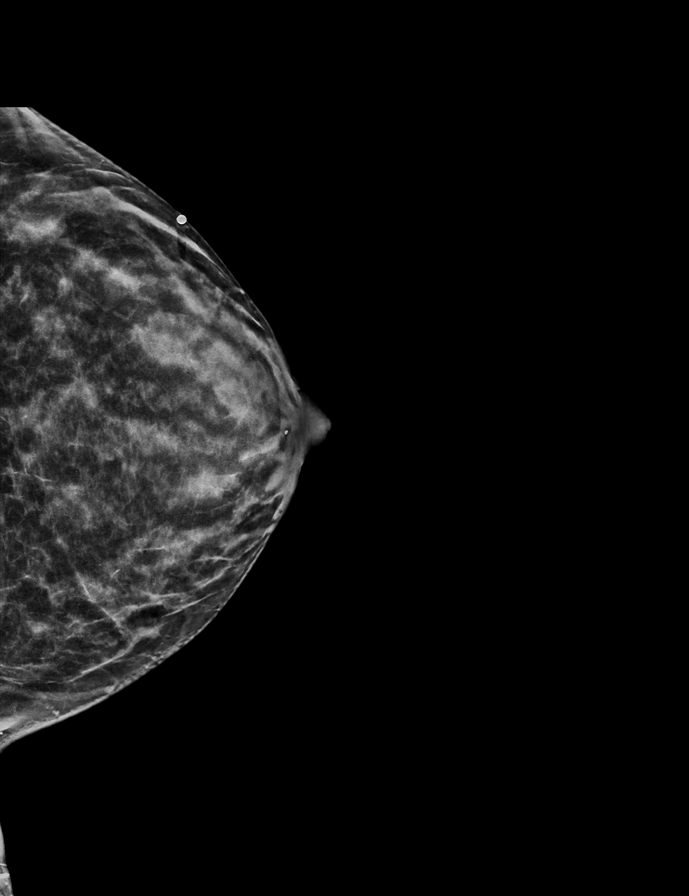

[8 of 40 positions shown; findings below may reference images not displayed]

ACR Breast Density Category c: The breast tissue is heterogeneously
dense, which may obscure small masses.
FINDINGS: RIGHT BREAST:

Mammogram: Within the UPPER-OUTER QUADRANT of the RIGHT breast,
there are 3 circumscribed oval masses with hyperdense
material/calcifications measuring 4 millimeters, 4 millimeters, and
10 millimeters in diameter. No additional findings identified in the
RIGHT breast. Mammographic images were processed with CAD.

Physical Exam: I palpate no abnormality in the UPPER RIGHT axilla.

Ultrasound: Targeted ultrasound is performed, showing oval
hypoechoic mass with hyperechoic center, consistent with
intramammary lymph node in the 10 o'clock location of the RIGHT
breast 8 centimeters from the nipple with cortical thickening of 1
millimeter. Adjacent similar masses measure 0.5 and 0.3 centimeters
and demonstrate normal cortical thickness. Evaluation of RIGHT
axilla shows lymph nodes with normal morphology.

LEFT BREAST:

Mammogram: No suspicious mass, distortion, or microcalcifications
are identified to suggest presence of malignancy. Spot tangential
view is performed in the area of concern in shows normal appearing
fibroglandular tissue. Mammographic images were processed with CAD.

Physical Exam: I palpate soft focal thickening in the 1:30 o'clock
location of the LEFT breast 4 centimeters from the nipple.

Ultrasound: Targeted ultrasound is performed, showing a group of
benign cysts in the 1:30 o'clock location corresponding to palpable
finding. A group of cysts measures 1.5 x 0.9 x 1.1 centimeters. No
internal blood flow identified with Doppler evaluation.
IMPRESSION: 1. Hyperdense material within RIGHT intramammary lymph nodes,
consistent with tattoo ink.
2. Palpable abnormality in the LEFT breast corresponds to
fibrocystic changes.
3.  No mammographic or ultrasound evidence for malignancy.

RECOMMENDATION:
Recommend screening mammogram at age 40 unless there are persistent
or intervening clinical concerns. (Code:DM-N-6SW)

I have discussed the findings and recommendations with the patient.
If applicable, a reminder letter will be sent to the patient
regarding the next appointment.

BI-RADS CATEGORY  2: Benign.

## 2022-04-01 ENCOUNTER — Encounter: Payer: Self-pay | Admitting: Physician Assistant

## 2022-04-01 ENCOUNTER — Ambulatory Visit: Payer: Commercial Managed Care - PPO | Admitting: Physician Assistant

## 2022-04-01 DIAGNOSIS — Z85820 Personal history of malignant melanoma of skin: Secondary | ICD-10-CM

## 2022-04-01 DIAGNOSIS — L918 Other hypertrophic disorders of the skin: Secondary | ICD-10-CM | POA: Diagnosis not present

## 2022-04-01 DIAGNOSIS — Z1283 Encounter for screening for malignant neoplasm of skin: Secondary | ICD-10-CM | POA: Diagnosis not present

## 2022-04-01 DIAGNOSIS — Z86018 Personal history of other benign neoplasm: Secondary | ICD-10-CM

## 2022-04-01 NOTE — Progress Notes (Signed)
? ?  Follow-Up Visit ?  ?Subjective  ?Tracey Watson is a 36 y.o. female who presents for the following: Follow-up (Here for 6 month follow up. No new concerns today with her moles. Patient does have a possible skin tag on butt cheek she wants checked. History of melanoma and atypical moles.). ? ? ?The following portions of the chart were reviewed this encounter and updated as appropriate:  Tobacco  Allergies  Meds  Problems  Med Hx  Surg Hx  Fam Hx   ?  ? ?Objective  ?Well appearing patient in no apparent distress; mood and affect are within normal limits. ? ?A full examination was performed including scalp, head, eyes, ears, nose, lips, neck, chest, axillae, abdomen, back, buttocks, bilateral upper extremities, bilateral lower extremities, hands, feet, fingers, toes, fingernails, and toenails. All findings within normal limits unless otherwise noted below. ? ?Full body skin examination -No atypical nevi or signs of NMSC noted at the time of the visit.  ? ?Gluteal Crease ?Fleshy, skin-colored sessile and pedunculated papules.   ? ? ?Assessment & Plan  ?Encounter for screening for malignant neoplasm of skin ? ?Yearly skin examination  ? ?Skin tag ?Gluteal Crease ? ?Destruction of lesion - Gluteal Crease ?Complexity: simple   ?Destruction method comment:  Scissors were used to snip tag at the base ?Informed consent: discussed and consent obtained   ?Timeout:  patient name, date of birth, surgical site, and procedure verified ?Anesthesia: the lesion was anesthetized in a standard fashion   ?Hemostasis achieved with:  pressure ?Outcome: patient tolerated procedure well with no complications   ?Post-procedure details: wound care instructions given   ? ? ? ?I, Jacqulynn Shappell, PA-C, have reviewed all documentation's for this visit.  The documentation on 04/01/22 for the exam, diagnosis, procedures and orders are all accurate and complete. ?

## 2022-06-07 NOTE — Progress Notes (Unsigned)
Established patient visit   Patient: Tracey Watson   DOB: 07-29-1986   36 y.o. Female  MRN: [id number] Visit Date: 06/08/2022   No chief complaint on file.  Subjective    HPI  Follow up visit.  -ADD --currently on adderall 20 mg up to three times daily if needed.  --PDMP profile reviewed. Overdose risk score is 090.  --fill history showing no red flags.  --needs to have controlled substances policy completed and signed.  --needs refills for medications today.    Medications: Outpatient Medications Prior to Visit  Medication Sig   ALPRAZolam (XANAX) 0.25 MG tablet TAKE 1 TABLET BY MOUTH AT BEDTIME AS NEEDED FOR ANXIETY   amphetamine-dextroamphetamine (ADDERALL) 20 MG tablet TAKE 1 TABLET BY MOUTH 3 TIMES DAILY AS NEEDED. DNF UNTIL 11/30   fluconazole (DIFLUCAN) 150 MG tablet Take 1 tablet po once. May repeat dose in 3 days as needed for persistent symptoms.   fluticasone (FLONASE) 50 MCG/ACT nasal spray Place 2 sprays into both nostrils daily.   ibuprofen (ADVIL) 800 MG tablet Take 1 tablet (800 mg total) by mouth every 6 (six) hours as needed.   loratadine (CLARITIN) 10 MG tablet Take by mouth.   nystatin (MYCOSTATIN) 100000 UNIT/ML suspension Swish and swallow with 60ms QID   scopolamine (TRANSDERM-SCOP) 1 MG/3DAYS Place 1 patch (1.5 mg total) onto the skin every 3 (three) days.   traZODone (DESYREL) 50 MG tablet Take 0.5-1 tablets (25-50 mg total) by mouth at bedtime as needed for sleep.   valACYclovir (VALTREX) 1000 MG tablet TAKE ONE TABLET BY MOUTH DAILY FOR FIVE DAYS   valACYclovir (VALTREX) 1000 MG tablet Take 1 tablet (1,000 mg total) by mouth 2 (two) times daily.   No facility-administered medications prior to visit.    Review of Systems  {Labs (Optional):23779}   Objective    There were no vitals taken for this visit. BP Readings from Last 3 Encounters:  03/17/22 124/78  12/17/21 108/73  09/17/21 116/75    Wt Readings from Last 3 Encounters:  03/17/22 128  lb 6.4 oz (58.2 kg)  12/17/21 128 lb (58.1 kg)  09/17/21 128 lb 3.2 oz (58.2 kg)    Physical Exam  ***  No results found for any visits on 06/08/22.  Assessment & Plan     Problem List Items Addressed This Visit   None    No follow-ups on file.         HRonnell Freshwater NP  [phone](phone) [phone](fax)  [place]

## 2022-06-08 ENCOUNTER — Encounter: Payer: Self-pay | Admitting: Nurse Practitioner

## 2022-06-08 ENCOUNTER — Ambulatory Visit (INDEPENDENT_AMBULATORY_CARE_PROVIDER_SITE_OTHER): Payer: Commercial Managed Care - PPO | Admitting: Nurse Practitioner

## 2022-06-08 VITALS — BP 110/73 | HR 80 | Temp 98.5°F | Ht 64.17 in | Wt 129.8 lb

## 2022-06-08 DIAGNOSIS — F411 Generalized anxiety disorder: Secondary | ICD-10-CM | POA: Diagnosis not present

## 2022-06-08 DIAGNOSIS — F988 Other specified behavioral and emotional disorders with onset usually occurring in childhood and adolescence: Secondary | ICD-10-CM | POA: Diagnosis not present

## 2022-06-08 DIAGNOSIS — F409 Phobic anxiety disorder, unspecified: Secondary | ICD-10-CM

## 2022-06-08 DIAGNOSIS — F5105 Insomnia due to other mental disorder: Secondary | ICD-10-CM | POA: Diagnosis not present

## 2022-06-08 MED ORDER — ZALEPLON 5 MG PO CAPS: 5.0000 mg | ORAL_CAPSULE | Freq: Every evening | ORAL | 2 refills | Status: DC | PRN

## 2022-06-08 MED ORDER — AMPHETAMINE-DEXTROAMPHETAMINE 20 MG PO TABS: ORAL_TABLET | ORAL | 0 refills | Status: DC

## 2022-06-08 MED ORDER — ALPRAZOLAM 0.25 MG PO TABS: ORAL_TABLET | ORAL | 2 refills | Status: DC

## 2022-06-17 ENCOUNTER — Ambulatory Visit: Payer: Commercial Managed Care - PPO | Admitting: Nurse Practitioner

## 2022-08-06 ENCOUNTER — Other Ambulatory Visit: Payer: Self-pay | Admitting: Nurse Practitioner

## 2022-08-06 DIAGNOSIS — F411 Generalized anxiety disorder: Secondary | ICD-10-CM

## 2022-08-06 DIAGNOSIS — F5105 Insomnia due to other mental disorder: Secondary | ICD-10-CM

## 2022-08-07 NOTE — Telephone Encounter (Signed)
Both filled with no additional refills. She does have controlled medication agreement dated 05/2022. Follow up appointment 09/08/2022.

## 2022-09-08 ENCOUNTER — Ambulatory Visit (INDEPENDENT_AMBULATORY_CARE_PROVIDER_SITE_OTHER): Payer: Commercial Managed Care - PPO | Admitting: Nurse Practitioner

## 2022-09-08 ENCOUNTER — Encounter: Payer: Self-pay | Admitting: Nurse Practitioner

## 2022-09-08 VITALS — BP 112/75 | HR 70 | Ht 64.17 in | Wt 128.8 lb

## 2022-09-08 DIAGNOSIS — F409 Phobic anxiety disorder, unspecified: Secondary | ICD-10-CM | POA: Diagnosis not present

## 2022-09-08 DIAGNOSIS — F5105 Insomnia due to other mental disorder: Secondary | ICD-10-CM

## 2022-09-08 DIAGNOSIS — J029 Acute pharyngitis, unspecified: Secondary | ICD-10-CM

## 2022-09-08 DIAGNOSIS — F988 Other specified behavioral and emotional disorders with onset usually occurring in childhood and adolescence: Secondary | ICD-10-CM | POA: Diagnosis not present

## 2022-09-08 LAB — POCT RAPID STREP A (OFFICE): Rapid Strep A Screen: NEGATIVE

## 2022-09-08 MED ORDER — AMPHETAMINE-DEXTROAMPHETAMINE 20 MG PO TABS: ORAL_TABLET | ORAL | 0 refills | Status: DC

## 2022-09-08 MED ORDER — ZALEPLON 10 MG PO CAPS: 10.0000 mg | ORAL_CAPSULE | Freq: Every evening | ORAL | 2 refills | Status: DC | PRN

## 2022-09-08 MED ORDER — AMOXICILLIN 400 MG/5ML PO SUSR: 800.0000 mg | Freq: Two times a day (BID) | ORAL | 0 refills | Status: DC

## 2022-09-08 NOTE — Progress Notes (Signed)
Established patient visit   Patient: Tracey Watson   DOB: 06-28-1986   36 y.o. Female  MRN: [id number] Visit Date: 09/08/2022   Chief Complaint  Patient presents with   Follow-up   Subjective    HPI  Follow up visit  -history of ADHD. Takes adderall 20 mg up to three times daily as needed for attention deficit.  -PDMP profile reviewed today. Her overdose risk score id 090.  -Last fill of adderall was 08/07/2022.  -last fill Zaleplon was 08/07/2022. -last fill alprazolam 0.25 mg was 08/06/2022   Today, she has scratchy throat, nasal congestion since Friday.  -may be allergies - has not taken anything.  -has had some cough drops.  -denies fever  -denies body aches or pain.  -denies cough or shortness of breath.    Medications: Outpatient Medications Prior to Visit  Medication Sig   ALPRAZolam (XANAX) 0.25 MG tablet TAKE 1 TABLET BY MOUTH AT BEDTIME AS NEEDED FOR ANXIETY   fluconazole (DIFLUCAN) 150 MG tablet Take 1 tablet po once. May repeat dose in 3 days as needed for persistent symptoms.   fluticasone (FLONASE) 50 MCG/ACT nasal spray Place 2 sprays into both nostrils daily.   ibuprofen (ADVIL) 800 MG tablet Take 1 tablet (800 mg total) by mouth every 6 (six) hours as needed.   loratadine (CLARITIN) 10 MG tablet Take by mouth.   nystatin (MYCOSTATIN) 100000 UNIT/ML suspension Swish and swallow with 35ms QID   scopolamine (TRANSDERM-SCOP) 1 MG/3DAYS Place 1 patch (1.5 mg total) onto the skin every 3 (three) days.   valACYclovir (VALTREX) 1000 MG tablet TAKE ONE TABLET BY MOUTH DAILY FOR FIVE DAYS   valACYclovir (VALTREX) 1000 MG tablet Take 1 tablet (1,000 mg total) by mouth 2 (two) times daily.   [DISCONTINUED] amphetamine-dextroamphetamine (ADDERALL) 20 MG tablet TAKE 1 TABLET BY MOUTH 3 TIMES DAILY AS NEEDED. DNF UNTIL 11/30   [DISCONTINUED] zaleplon (SONATA) 5 MG capsule TAKE 1 CAPSULE BY MOUTH AT BEDTIME AS NEEDED FOR SLEEP *STOP TAKING TRAZODONE*   No  facility-administered medications prior to visit.    Review of Systems  Constitutional:  Negative for activity change, appetite change, chills, fatigue and fever.  HENT:  Positive for congestion and sore throat. Negative for postnasal drip, rhinorrhea, sinus pressure, sinus pain and sneezing.   Eyes: Negative.   Respiratory:  Negative for cough, chest tightness, shortness of breath and wheezing.   Cardiovascular:  Negative for chest pain and palpitations.  Gastrointestinal:  Negative for abdominal pain, constipation, diarrhea, nausea and vomiting.  Endocrine: Negative for cold intolerance, heat intolerance, polydipsia and polyuria.  Genitourinary:  Negative for dyspareunia, dysuria, flank pain, frequency and urgency.  Musculoskeletal:  Negative for arthralgias, back pain and myalgias.  Skin:  Negative for rash.  Allergic/Immunologic: Positive for environmental allergies.  Neurological:  Negative for dizziness, weakness and headaches.  Hematological:  Negative for adenopathy.  Psychiatric/Behavioral:  Positive for decreased concentration and sleep disturbance. The patient is nervous/anxious.        Objective     Today's Vitals   09/08/22 0955  BP: 112/75  Pulse: 70  SpO2: 100%  Weight: 128 lb 12.8 oz (58.4 kg)  Height: 5' 4.17" (1.63 m)   Body mass index is 21.99 kg/m.   BP Readings from Last 3 Encounters:  09/08/22 112/75  06/08/22 110/73  03/17/22 124/78    Wt Readings from Last 3 Encounters:  09/08/22 128 lb 12.8 oz (58.4 kg)  06/08/22 129 lb 12.8 oz (58.9 kg)  03/17/22 128 lb 6.4 oz (58.2 kg)    Physical Exam Vitals and nursing note reviewed.  Constitutional:      Appearance: Normal appearance. She is well-developed.  HENT:     Head: Normocephalic and atraumatic.     Right Ear: Tympanic membrane, ear canal and external ear normal.     Left Ear: Tympanic membrane, ear canal and external ear normal.     Nose: Nose normal.     Mouth/Throat:     Mouth: Mucous  membranes are moist.   Eyes:     Extraocular Movements: Extraocular movements intact.     Conjunctiva/sclera: Conjunctivae normal.     Pupils: Pupils are equal, round, and reactive to light.  Cardiovascular:     Rate and Rhythm: Normal rate and regular rhythm.     Pulses: Normal pulses.     Heart sounds: Normal heart sounds.  Pulmonary:     Effort: Pulmonary effort is normal.     Breath sounds: Normal breath sounds.  Abdominal:     Palpations: Abdomen is soft.  Musculoskeletal:        General: Normal range of motion.     Cervical back: Normal range of motion and neck supple.  Lymphadenopathy:     Cervical: Cervical adenopathy present.  Skin:    General: Skin is warm and dry.     Capillary Refill: Capillary refill takes less than 2 seconds.  Neurological:     General: No focal deficit present.     Mental Status: She is alert and oriented to person, place, and time.  Psychiatric:        Mood and Affect: Mood normal.        Behavior: Behavior normal.        Thought Content: Thought content normal.        Judgment: Judgment normal.      Results for orders placed or performed in visit on 09/08/22  POCT rapid strep A  Result Value Ref Range   Rapid Strep A Screen Negative Negative    Assessment & Plan    1. Acute sore throat Strep tet negative during today's visit. Suspicious for tonsillitis. Treat with amoxicillin suspension - 800 mg twice daily for 10 days. Rest and increase fluids. Continue using OTC medication to control symptoms.   - POCT rapid strep A - amoxicillin (AMOXIL) 400 MG/5ML suspension; Take 10 mLs (800 mg total) by mouth 2 (two) times daily.  Dispense: 200 mL; Refill: 0  2. Attention deficit disorder (ADD) in adult Will continue adderall 20 mg up to three times daily as needed for focus. Three 30 day prescriptions sent to her pharmacy today. Dates are 09/08/2022, 10/06/2022, and 11/04/2022.  - amphetamine-dextroamphetamine (ADDERALL) 20 MG tablet; TAKE 1  TABLET BY MOUTH 3 TIMES DAILY AS NEEDED. DNF UNTIL 11/30  Dispense: 90 tablet; Refill: 0  3. Insomnia due to anxiety and fear Increase sonata to 10 mg at bedtime a needed for insomnia. New prescription sent to her pharmacy today  - zaleplon (SONATA) 10 MG capsule; Take 1 capsule (10 mg total) by mouth at bedtime as needed for sleep.  Dispense: 30 capsule; Refill: 2   Problem List Items Addressed This Visit       Other   Attention deficit disorder (ADD) in adult   Relevant Medications   amphetamine-dextroamphetamine (ADDERALL) 20 MG tablet   Insomnia due to anxiety and fear   Relevant Medications   zaleplon (SONATA) 10 MG capsule   Acute sore throat -  Primary   Relevant Medications   amoxicillin (AMOXIL) 400 MG/5ML suspension   Other Relevant Orders   POCT rapid strep A (Completed)     Return in about 3 months (around 12/08/2022) for health maintenance exam, with pap - 40 minutes for this please. Marland Kitchen         Ronnell Freshwater, NP  [facility] at [facility] [phone] (phone) [phone] (fax)  [place]

## 2022-11-10 ENCOUNTER — Ambulatory Visit: Payer: Commercial Managed Care - PPO | Admitting: Physician Assistant

## 2022-12-07 ENCOUNTER — Encounter: Payer: Self-pay | Admitting: Nurse Practitioner

## 2022-12-07 DIAGNOSIS — F988 Other specified behavioral and emotional disorders with onset usually occurring in childhood and adolescence: Secondary | ICD-10-CM

## 2022-12-08 ENCOUNTER — Other Ambulatory Visit: Payer: Self-pay | Admitting: Nurse Practitioner

## 2022-12-08 DIAGNOSIS — F409 Phobic anxiety disorder, unspecified: Secondary | ICD-10-CM

## 2022-12-08 DIAGNOSIS — F988 Other specified behavioral and emotional disorders with onset usually occurring in childhood and adolescence: Secondary | ICD-10-CM

## 2022-12-08 NOTE — Telephone Encounter (Signed)
L.O.V: 09/08/22  N.O.V: 12/10/22  L.R.F: 09/08/22 90 tab 0 refill

## 2022-12-08 NOTE — Telephone Encounter (Signed)
Pt called inquiring about these medications.  She is out and would like them sent in today if possible.  She has appointment on 12/10/2022. Manjit Bufano Zimmerman Rumple, CMA

## 2022-12-10 ENCOUNTER — Telehealth: Payer: Self-pay | Admitting: *Deleted

## 2022-12-10 ENCOUNTER — Encounter: Payer: Commercial Managed Care - PPO | Admitting: Nurse Practitioner
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
## 2022-12-10 NOTE — Telephone Encounter (Signed)
She can have her medications filled until then. She will just need to let me know when she needs them. Thanks so much.   -HB

## 2022-12-10 NOTE — Telephone Encounter (Signed)
Pt had to cancel her appointment due to her child being sick and we rescheduled for her physical with pap in 03/2023 due to schedule being booked.  Pt wanted to know if she will be able to have her medications filled until then or does she need to make a separate appointment for medication management.  She said the appt she had today would have been for her physical/pap/and medication renewal for upcoming year. Please advise. Ivannia Willhelm Zimmerman Rumple, CMA

## 2023-01-06 ENCOUNTER — Other Ambulatory Visit: Payer: Self-pay | Admitting: Nurse Practitioner

## 2023-01-06 DIAGNOSIS — F409 Phobic anxiety disorder, unspecified: Secondary | ICD-10-CM

## 2023-01-06 DIAGNOSIS — F988 Other specified behavioral and emotional disorders with onset usually occurring in childhood and adolescence: Secondary | ICD-10-CM

## 2023-01-06 MED ORDER — ZALEPLON 10 MG PO CAPS: 10.0000 mg | ORAL_CAPSULE | Freq: Every evening | ORAL | 2 refills | Status: DC | PRN

## 2023-01-06 MED ORDER — AMPHETAMINE-DEXTROAMPHETAMINE 20 MG PO TABS: ORAL_TABLET | ORAL | 0 refills | Status: DC

## 2023-01-24 ENCOUNTER — Ambulatory Visit
Admission: EM | Admit: 2023-01-24 | Discharge: 2023-01-24 | Disposition: A | Discharge status: Home / Self Care | Payer: Commercial Managed Care - PPO | Attending: Physician Assistant | Admitting: Physician Assistant

## 2023-01-24 ENCOUNTER — Ambulatory Visit (INDEPENDENT_AMBULATORY_CARE_PROVIDER_SITE_OTHER): Payer: Commercial Managed Care - PPO

## 2023-01-24 DIAGNOSIS — J209 Acute bronchitis, unspecified: Secondary | ICD-10-CM

## 2023-01-24 DIAGNOSIS — R0602 Shortness of breath: Secondary | ICD-10-CM | POA: Insufficient documentation

## 2023-01-24 DIAGNOSIS — Z1152 Encounter for screening for COVID-19: Secondary | ICD-10-CM | POA: Insufficient documentation

## 2023-01-24 DIAGNOSIS — R051 Acute cough: Secondary | ICD-10-CM

## 2023-01-24 DIAGNOSIS — R0989 Other specified symptoms and signs involving the circulatory and respiratory systems: Secondary | ICD-10-CM | POA: Diagnosis not present

## 2023-01-24 LAB — SARS CORONAVIRUS 2 BY RT PCR: SARS Coronavirus 2 by RT PCR: NEGATIVE

## 2023-01-24 MED ORDER — CHERATUSSIN AC 100-10 MG/5ML PO SOLN: 10.0000 mL | Freq: Three times a day (TID) | ORAL | 0 refills | Status: DC | PRN

## 2023-01-24 MED ORDER — PREDNISONE 20 MG PO TABS: 40.0000 mg | ORAL_TABLET | Freq: Every day | ORAL | 0 refills | Status: AC

## 2023-01-24 NOTE — ED Provider Notes (Signed)
[facility]    CSN: [id number] Arrival date & time: 01/24/23  1207      History   Chief Complaint Chief Complaint  Patient presents with   Cough    HPI Tracey Watson is a 37 y.o. female presenting for fever, fatigue, cough, nasal congestion, body aches, mild sore throat, left-sided ear pain, chest congestion, and bilateral rib pain due to coughing x 4 days.  Reports her stepchild had similar symptoms patient says she just feels like she is getting sicker.  Does try to rest, increase fluids and take cough medicine without relief.  She reports some shortness of breath with coughing fits.  Denies abdominal pain, vomiting or diarrhea.  No known exposure to flu or COVID.  No other complaints.  HPI  Past Medical History:  Diagnosis Date   Anxiety    Atypical mole 11/06/2010   Right Ant. 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 Nancy Fetter)   Ectopic pregnancy 08/11/2012   Headache    migraines   History of kidney stones    h/o   Hot flashes    Irregular heart beat    Melanoma ([place]) 2004   Upper Back (37yr old)    Patient Active Problem List   Diagnosis Date Noted   Acute sore throat 09/08/2022   Seasonal allergic rhinitis due to pollen 03/22/2022   Insomnia due to anxiety and fear 03/22/2022   Mass of upper outer quadrant of left breast 09/28/2021   Hyperkalemia 09/28/2021   Encounter to establish care 02/25/2021   Unprotected sex 02/25/2021   STD exposure 02/25/2021   Health care maintenance 02/25/2021   Oral mucositis 12/22/2020   Encounter for general adult medical examination with abnormal findings 06/15/2020   Dysuria 06/15/2020   PTSD (post-traumatic stress disorder) 10/31/2019   Attention deficit hyperactivity  disorder (ADHD), 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   Acute pain of right knee 10/11/2019   Numbness 10/11/2019   Headache syndrome 08/18/2019   Encounter for long-term (current) use of medications 08/18/2019   Attention deficit disorder (ADD) in adult 05/30/2019   Other fatigue 01/22/2019   Acquired hypothyroidism 01/22/2019   Generalized anxiety disorder 01/22/2019   Seasonal allergies 01/13/2019   Closed fracture of fourth metatarsal bone 09/28/2018   Sprain of ankle 09/28/2018   Sprain of MCL (medial collateral ligament) of knee 09/28/2018   [place]15[place]   IUD contraception 10/15/2016   Malignant melanoma of torso excluding breast ([place] 10/15/2016    Past Surgical History:  Procedure Laterality Date   APPENDECTOMY     COLPOSCOPY  2003   COMBINED HYSTEROSCOPY DIAGNOSTIC / D&C  08/11/2012   HYSTEROSCOPY WITH D & C  04/07/2018   Procedure: DILATATION AND CURETTAGE /HYSTEROSCOPY;  Surgeon: SMalachy Mood MD;  Location: [facility];  Service: Gynecology;;   IUD REMOVAL N/A 04/07/2018   Procedure: INTRAUTERINE DEVICE (IUD) REMOVAL;  Surgeon: SMalachy Mood MD;  Location: [facility];  Service: Gynecology;  Laterality: N/A;   LAPAROSCOPIC UNILATERAL SALPINGECTOMY Left 04/07/2018   Procedure: LAPAROSCOPIC LEFT SALPINGECTOMY;  Surgeon: SMalachy Mood MD;  Location: [facility];  Service: Gynecology;  Laterality: Left;   LAPAROSCOPY  08/11/2012, 08/2012   Right salpingostomy Ectopic pregnancy by KCristino Martes  08/2012 Right Salpingectomy remaining ectopic   MELANOMA EXCISION     SALPINGECTOMY  08/2012    OB History     Gravida  2   Para  1   Term  1   Preterm      AB  1   Living  1      SAB      IAB      Ectopic  1   Multiple      Live Births  1            Home Medications    Prior to Admission medications   Medication Sig Start Date End Date Taking? Authorizing Provider  ALPRAZolam (XANAX) 0.25 MG tablet TAKE 1 TABLET BY MOUTH AT  BEDTIME AS NEEDED FOR ANXIETY 08/07/22  Yes Boscia, Levette E, NP  amphetamine-dextroamphetamine (ADDERALL) 20 MG tablet TAKE 1 TABLET BY MOUTH 3 TIMES DAILY AS DIRECTED 01/06/23  Yes Boscia, Emmah E, NP  fluticasone (FLONASE) 50 MCG/ACT nasal spray Place 2 sprays into both nostrils daily. 03/17/22  Yes Boscia, Etheline E, NP  guaiFENesin-codeine (CHERATUSSIN AC) 100-10 MG/5ML syrup Take 10 mLs by mouth 3 (three) times daily as needed. 01/24/23  Yes Laurene Footman B, PA-C  ibuprofen (ADVIL) 800 MG tablet Take 1 tablet (800 mg total) by mouth every 6 (six) hours as needed. 12/17/21  Yes Boscia, Greer Ee, NP  predniSONE (DELTASONE) 20 MG tablet Take 2 tablets (40 mg total) by mouth daily for 5 days. 01/24/23 01/29/23 Yes Danton Clap, PA-C  zaleplon (SONATA) 10 MG capsule Take 1 capsule (10 mg total) by mouth at bedtime as needed for sleep. 01/06/23  Yes Boscia, Greer Ee, NP  loratadine (CLARITIN) 10 MG tablet Take by mouth.    [provider]  scopolamine (TRANSDERM-SCOP) 1 MG/3DAYS Place 1 patch (1.5 mg total) onto the skin every 3 (three) days. 05/05/21   Ronnell Freshwater, NP    Family History History reviewed. No pertinent family history.  Social History Social History   Tobacco Use   Smoking status: Former    Packs/day: 0.25    Years: 17.00    Total pack years: 4.25    Types: E-cigarettes, Cigarettes   Smokeless tobacco: Never  Vaping Use   Vaping Use: Every day   Devices: vape  Substance Use Topics   Alcohol use: Yes    Comment: Socially   Drug use: No     Allergies   Patient has no known allergies.   Review of Systems Review of Systems  Constitutional:  Positive for fatigue and fever. Negative for chills and diaphoresis.  HENT:  Positive for congestion, ear pain, rhinorrhea and sore throat. Negative for sinus pressure and sinus pain.   Respiratory:  Positive for cough and shortness of breath. Negative for wheezing.   Cardiovascular:  Positive for chest pain.   Gastrointestinal:  Negative for abdominal pain, nausea and vomiting.  Musculoskeletal:  Positive for myalgias.  Skin:  Negative for rash.  Neurological:  Negative for weakness and headaches.  Hematological:  Negative for adenopathy.     Physical Exam Triage Vital Signs ED Triage Vitals  Enc Vitals Group     BP      Pulse      Resp      Temp      Temp src      SpO2      Weight      Height      Head Circumference  Peak Flow      Pain Score      Pain Loc      Pain Edu?      Excl. in [place]?    No data found.  Updated Vital Signs BP (!) 124/90 (BP Location: Left Arm)   Pulse 72   Temp 98.5 F (36.9 C) (Oral)   Resp 18   Ht '5\' 4"'$  (1.626 m)   Wt 130 lb (59 kg)   LMP 01/24/2023   SpO2 100%   BMI 22.31 kg/m   Physical Exam Vitals and nursing note reviewed.  Constitutional:      General: She is not in acute distress.    Appearance: Normal appearance. She is ill-appearing. She is not toxic-appearing.  HENT:     Head: Normocephalic and atraumatic.     Right Ear: Tympanic membrane, ear canal and external ear normal.     Left Ear: Ear canal and external ear normal. A middle ear effusion is present.     Nose: Congestion present.     Mouth/Throat:     Mouth: Mucous membranes are moist.     Pharynx: Oropharynx is clear. Posterior oropharyngeal erythema (mild) present.  Eyes:     General: No scleral icterus.       Right eye: No discharge.        Left eye: No discharge.     Conjunctiva/sclera: Conjunctivae normal.  Cardiovascular:     Rate and Rhythm: Normal rate and regular rhythm.     Heart sounds: Normal heart sounds.  Pulmonary:     Effort: Pulmonary effort is normal. No respiratory distress.     Breath sounds: Rhonchi present.  Musculoskeletal:     Cervical back: Neck supple.  Skin:    General: Skin is dry.  Neurological:     General: No focal deficit present.     Mental Status: She is alert. Mental status is at baseline.     Motor: No weakness.      Gait: Gait normal.  Psychiatric:        Mood and Affect: Mood normal.        Behavior: Behavior normal.        Thought Content: Thought content normal.      UC Treatments / Results  Labs (all labs ordered are listed, but only abnormal results are displayed) Labs Reviewed  SARS CORONAVIRUS 2 BY RT PCR    EKG   Radiology DG Chest 2 View  Result Date: 01/24/2023 CLINICAL DATA:  Cough, chest congestion, shortness of breath EXAM: CHEST - 2 VIEW COMPARISON:  09/23/2012 FINDINGS: The heart size and mediastinal contours are within normal limits. Both lungs are clear. The visualized skeletal structures are unremarkable. IMPRESSION: No acute abnormality of the lungs. Electronically Signed   By: Delanna Ahmadi M.D.   On: 01/24/2023 13:11    Procedures Procedures (including critical care time)  Medications Ordered in UC Medications - No data to display  Initial Impression / Assessment and Plan / UC Course  I have reviewed the triage vital signs and the nursing notes.  Pertinent labs & imaging results that were available during my care of the patient were reviewed by me and considered in my medical decision making (see chart for details).   37 year old female presents for cough, congestion, fever, fatigue, body aches, chest pain and rib pain due to coughing for the past 4 days.  Stepchild had similar symptoms but no known exposure to flu or COVID.  She is currently  afebrile.  She is mildly ill-appearing but nontoxic-appearing exam as clear effusion of left TM, nasal congestion, very mild posterior pharyngeal erythema and diffuse scattered rhonchi throughout upper lung fields.  PCR COVID test and chest x-ray ordered. Negative COVID and CXR. Discussed all results.  Viral bronchitis. Advised patient she may be ill for a couple of weeks. Sent Cheratussin and prednisone. Supportive care. Reviewed return and ED precautions.   Final Clinical Impressions(s) / UC Diagnoses   Final diagnoses:   Acute bronchitis, unspecified organism  Acute cough  Chest congestion     Discharge Instructions      -Negative COVID test and the x-ray does not show any evidence of pneumonia.  You have viral bronchitis which can last for couple weeks but - I sent prednisone and a strong cough medicine to the pharmacy.  Increase rest and fluids. - Return if you develop a fever again or have increased shortness of breath or weakness.     ED Prescriptions     Medication Sig Dispense Auth. Provider   guaiFENesin-codeine (CHERATUSSIN AC) 100-10 MG/5ML syrup Take 10 mLs by mouth 3 (three) times daily as needed. 120 mL Laurene Footman B, PA-C   predniSONE (DELTASONE) 20 MG tablet Take 2 tablets (40 mg total) by mouth daily for 5 days. 10 tablet Danton Clap, PA-C      I have reviewed the PDMP during this encounter.   Danton Clap, PA-C 01/24/23 1353

## 2023-01-24 NOTE — ED Triage Notes (Signed)
Pt c/o cough, left ear pain, body aches, chest congestion, chest and rib pain x5days

## 2023-01-24 NOTE — Discharge Instructions (Addendum)
-  Negative COVID test and the x-ray does not show any evidence of pneumonia.  You have viral bronchitis which can last for couple weeks but - I sent prednisone and a strong cough medicine to the pharmacy.  Increase rest and fluids. - Return if you develop a fever again or have increased shortness of breath or weakness.

## 2023-02-04 ENCOUNTER — Other Ambulatory Visit: Payer: Self-pay | Admitting: Nurse Practitioner

## 2023-02-04 DIAGNOSIS — F988 Other specified behavioral and emotional disorders with onset usually occurring in childhood and adolescence: Secondary | ICD-10-CM

## 2023-02-04 MED ORDER — AMPHETAMINE-DEXTROAMPHETAMINE 20 MG PO TABS: ORAL_TABLET | ORAL | 0 refills | Status: DC

## 2023-02-04 NOTE — Telephone Encounter (Signed)
L.O.V: 09/08/22  N.O.V: 04/08/23  L.R.F: 01/06/23 90 tab 0 refill

## 2023-03-05 ENCOUNTER — Encounter: Payer: Self-pay | Admitting: Nurse Practitioner

## 2023-03-05 ENCOUNTER — Other Ambulatory Visit: Payer: Self-pay | Admitting: Nurse Practitioner

## 2023-03-05 DIAGNOSIS — F988 Other specified behavioral and emotional disorders with onset usually occurring in childhood and adolescence: Secondary | ICD-10-CM

## 2023-03-08 ENCOUNTER — Other Ambulatory Visit: Payer: Self-pay | Admitting: Nurse Practitioner

## 2023-03-08 DIAGNOSIS — G4489 Other headache syndrome: Secondary | ICD-10-CM

## 2023-03-08 DIAGNOSIS — F409 Phobic anxiety disorder, unspecified: Secondary | ICD-10-CM

## 2023-03-08 DIAGNOSIS — F988 Other specified behavioral and emotional disorders with onset usually occurring in childhood and adolescence: Secondary | ICD-10-CM

## 2023-03-08 MED ORDER — AMPHETAMINE-DEXTROAMPHETAMINE 20 MG PO TABS: ORAL_TABLET | ORAL | 0 refills | Status: DC

## 2023-03-08 MED ORDER — IBUPROFEN 800 MG PO TABS: 800.0000 mg | ORAL_TABLET | Freq: Four times a day (QID) | ORAL | 2 refills | Status: DC | PRN

## 2023-04-05 ENCOUNTER — Other Ambulatory Visit: Payer: Self-pay | Admitting: Nurse Practitioner

## 2023-04-05 DIAGNOSIS — F988 Other specified behavioral and emotional disorders with onset usually occurring in childhood and adolescence: Secondary | ICD-10-CM

## 2023-04-05 MED ORDER — AMPHETAMINE-DEXTROAMPHETAMINE 20 MG PO TABS
ORAL_TABLET | ORAL | 0 refills | Status: DC
Start: 2023-04-05 — End: 2023-05-04

## 2023-04-08 ENCOUNTER — Encounter: Payer: Commercial Managed Care - PPO | Admitting: Nurse Practitioner

## 2023-04-19 ENCOUNTER — Telehealth: Payer: Self-pay | Admitting: *Deleted

## 2023-04-19 NOTE — Telephone Encounter (Signed)
LVM for pt to call office to see abot rescheduling her upcoming appointment due to provider not being available. Will also send a message through MyChart.

## 2023-04-22 ENCOUNTER — Encounter: Payer: Commercial Managed Care - PPO | Admitting: Nurse Practitioner

## 2023-05-04 ENCOUNTER — Other Ambulatory Visit: Payer: Self-pay | Admitting: Nurse Practitioner

## 2023-05-04 DIAGNOSIS — F988 Other specified behavioral and emotional disorders with onset usually occurring in childhood and adolescence: Secondary | ICD-10-CM

## 2023-05-04 MED ORDER — AMPHETAMINE-DEXTROAMPHETAMINE 20 MG PO TABS
ORAL_TABLET | ORAL | 0 refills | Status: DC
Start: 2023-05-04 — End: 2023-06-03

## 2023-05-24 ENCOUNTER — Encounter: Payer: Commercial Managed Care - PPO | Admitting: Nurse Practitioner

## 2023-06-01 ENCOUNTER — Ambulatory Visit
Admission: RE | Admit: 2023-06-01 | Discharge: 2023-06-01 | Disposition: A | Discharge status: Home / Self Care | Payer: Commercial Managed Care - PPO | Source: Ambulatory Visit | Attending: Family Medicine | Admitting: Family Medicine

## 2023-06-01 ENCOUNTER — Other Ambulatory Visit: Payer: Self-pay | Admitting: Family Medicine

## 2023-06-01 DIAGNOSIS — M7989 Other specified soft tissue disorders: Secondary | ICD-10-CM | POA: Diagnosis not present

## 2023-06-03 ENCOUNTER — Ambulatory Visit (INDEPENDENT_AMBULATORY_CARE_PROVIDER_SITE_OTHER): Payer: Commercial Managed Care - PPO | Admitting: Nurse Practitioner

## 2023-06-03 ENCOUNTER — Encounter: Payer: Self-pay | Admitting: Nurse Practitioner

## 2023-06-03 ENCOUNTER — Other Ambulatory Visit (HOSPITAL_COMMUNITY)
Admission: RE | Admit: 2023-06-03 | Discharge: 2023-06-03 | Disposition: A | Discharge status: Home / Self Care | Payer: Commercial Managed Care - PPO | Source: Ambulatory Visit | Attending: Nurse Practitioner | Admitting: Nurse Practitioner

## 2023-06-03 VITALS — BP 128/81 | HR 87 | Ht 64.0 in | Wt 134.0 lb

## 2023-06-03 DIAGNOSIS — F409 Phobic anxiety disorder, unspecified: Secondary | ICD-10-CM

## 2023-06-03 DIAGNOSIS — F411 Generalized anxiety disorder: Secondary | ICD-10-CM

## 2023-06-03 DIAGNOSIS — T63441A Toxic effect of venom of bees, accidental (unintentional), initial encounter: Secondary | ICD-10-CM

## 2023-06-03 DIAGNOSIS — F988 Other specified behavioral and emotional disorders with onset usually occurring in childhood and adolescence: Secondary | ICD-10-CM

## 2023-06-03 DIAGNOSIS — F5105 Insomnia due to other mental disorder: Secondary | ICD-10-CM | POA: Diagnosis not present

## 2023-06-03 DIAGNOSIS — Z01419 Encounter for gynecological examination (general) (routine) without abnormal findings: Secondary | ICD-10-CM | POA: Insufficient documentation

## 2023-06-03 DIAGNOSIS — Z87898 Personal history of other specified conditions: Secondary | ICD-10-CM

## 2023-06-03 MED ORDER — AMPHETAMINE-DEXTROAMPHETAMINE 20 MG PO TABS
ORAL_TABLET | ORAL | 0 refills | Status: DC
Start: 2023-06-03 — End: 2023-07-02

## 2023-06-03 MED ORDER — ALPRAZOLAM 0.25 MG PO TABS
ORAL_TABLET | ORAL | 0 refills | Status: DC
Start: 2023-06-03 — End: 2023-09-03

## 2023-06-03 MED ORDER — ZALEPLON 10 MG PO CAPS
ORAL_CAPSULE | ORAL | 2 refills | Status: DC
Start: 2023-06-03 — End: 2023-11-01

## 2023-06-03 MED ORDER — SCOPOLAMINE 1 MG/3DAYS TD PT72SCOPOLAMINE 1 MG/3DAYS
1.0000 | MEDICATED_PATCH | TRANSDERMAL | 12 refills | Status: DC
Start: 2023-06-03 — End: 2024-08-23

## 2023-06-03 MED ORDER — EPINEPHRINE 0.3 MG/0.3ML IJ SOAJ
0.3000 mg | INTRAMUSCULAR | 1 refills | Status: AC | PRN
Start: 2023-06-03 — End: ?
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
## 2023-06-07 LAB — CYTOLOGY - PAP
Adequacy: ABSENT
Comment: NEGATIVE
Diagnosis: NEGATIVE
High risk HPV: NEGATIVE

## 2023-06-07 NOTE — Progress Notes (Signed)
Please let the patient know that her pap smear came back normal and that we should repeat this in three years. There is evidence to suggest  bacteria vaginosis. I typically treat this with metronidazole. This can be oral or vaginal gel. Does she have a preference? With oral tablets, she should not drink any alcohol while taking it. Causes severe nausea and belly pain. Also, where should I send it to?  Thanks so much.   -HB

## 2023-06-10 ENCOUNTER — Other Ambulatory Visit: Payer: Self-pay | Admitting: Nurse Practitioner

## 2023-06-10 ENCOUNTER — Telehealth: Payer: Self-pay

## 2023-06-10 DIAGNOSIS — B9689 Other specified bacterial agents as the cause of diseases classified elsewhere: Secondary | ICD-10-CM

## 2023-06-10 MED ORDER — METRONIDAZOLE 0.75 % VA GEL
1.0000 | Freq: Every day | VAGINAL | 0 refills | Status: DC
Start: 2023-06-10 — End: 2023-09-03

## 2023-06-10 NOTE — Telephone Encounter (Signed)
Pt was calling to follow up on the medication that was going to be sent in for BV.   Pt prefers Vaginal gel.  [facility] [place], [place] - [address]

## 2023-06-10 NOTE — Telephone Encounter (Signed)
Please let the patient know that I sent the vaginal gel to [facility] in [place]. She should use one applicatorful nightly for the next 7 nights. Thanks so much.   -HB

## 2023-06-20 DIAGNOSIS — Z87898 Personal history of other specified conditions: Secondary | ICD-10-CM | POA: Insufficient documentation

## 2023-06-20 DIAGNOSIS — T63441A Toxic effect of venom of bees, accidental (unintentional), initial encounter: Secondary | ICD-10-CM | POA: Insufficient documentation

## 2023-06-20 NOTE — Assessment & Plan Note (Signed)
Patient has history of motion sickness and getting ready to go on cruise for vacation. -New prescription for scopolamine patch sent to pharmacy.  Reviewed instructions to change every 72 hours, removing patch when applying new one.

## 2023-06-20 NOTE — Assessment & Plan Note (Signed)
New, anaphylactic reaction to bee sting. -Prescription for EpiPen sent to pharmacy today. -Advised to seek emergency care if having to use EpiPen.  She voiced understanding and agreement.

## 2023-06-20 NOTE — Assessment & Plan Note (Signed)
Well-managed -May continue Sonata at bedtime as needed.  New prescription sent today.

## 2023-06-20 NOTE — Assessment & Plan Note (Signed)
>>  ASSESSMENT AND PLAN FOR ATTENTION DEFICIT DISORDER (ADD) IN ADULT WRITTEN ON 06/20/2023  3:28 PM BY BOSCIA, Aarushi E, NP  Stable with current Adderall dosing of 20 mg 3 times daily as needed. -three every day prescription sent to her pharmacy today.

## 2023-06-20 NOTE — Assessment & Plan Note (Signed)
Stable with current Adderall dosing of 20 mg 3 times daily as needed. -three every day prescription sent to her pharmacy today. 

## 2023-06-20 NOTE — Assessment & Plan Note (Signed)
May take alprazolam 0.25 mg once daily as needed for acute anxiety. -New prescription sent to the pharmacy.

## 2023-06-20 NOTE — Assessment & Plan Note (Deleted)
Stable with current Adderall dosing of 20 mg 3 times daily as needed. -three every day prescription sent to her pharmacy today.

## 2023-07-02 ENCOUNTER — Other Ambulatory Visit: Payer: Self-pay | Admitting: Nurse Practitioner

## 2023-07-02 DIAGNOSIS — F411 Generalized anxiety disorder: Secondary | ICD-10-CM

## 2023-07-02 DIAGNOSIS — F988 Other specified behavioral and emotional disorders with onset usually occurring in childhood and adolescence: Secondary | ICD-10-CM

## 2023-07-02 MED ORDER — AMPHETAMINE-DEXTROAMPHETAMINE 20 MG PO TABS
ORAL_TABLET | ORAL | 0 refills | Status: DC
Start: 2023-07-02 — End: 2023-08-02

## 2023-07-02 NOTE — Telephone Encounter (Signed)
Medication should be taken on an as-needed basis.  Last fill had 30 tablets, which was sent in only 1 month ago.  It is too early for refill.

## 2023-08-02 ENCOUNTER — Other Ambulatory Visit: Payer: Self-pay | Admitting: Family Medicine

## 2023-08-02 DIAGNOSIS — F988 Other specified behavioral and emotional disorders with onset usually occurring in childhood and adolescence: Secondary | ICD-10-CM

## 2023-08-02 MED ORDER — AMPHETAMINE-DEXTROAMPHETAMINE 20 MG PO TABS
ORAL_TABLET | ORAL | 0 refills | Status: DC
Start: 2023-08-02 — End: 2023-09-02

## 2023-08-11 ENCOUNTER — Encounter: Payer: Self-pay | Admitting: Family Medicine

## 2023-08-11 DIAGNOSIS — B3731 Acute candidiasis of vulva and vagina: Secondary | ICD-10-CM

## 2023-08-11 MED ORDER — FLUCONAZOLE 150 MG PO TABS
150.0000 mg | ORAL_TABLET | Freq: Once | ORAL | 1 refills | Status: AC
Start: 2023-08-11 — End: 2023-08-11

## 2023-09-02 ENCOUNTER — Other Ambulatory Visit: Payer: Self-pay | Admitting: Family Medicine

## 2023-09-02 DIAGNOSIS — F988 Other specified behavioral and emotional disorders with onset usually occurring in childhood and adolescence: Secondary | ICD-10-CM

## 2023-09-02 MED ORDER — AMPHETAMINE-DEXTROAMPHETAMINE 20 MG PO TABS
ORAL_TABLET | ORAL | 0 refills | Status: DC
Start: 2023-09-02 — End: 2023-09-03

## 2023-09-02 NOTE — Telephone Encounter (Signed)
PDMP reviewed, no aberrancies

## 2023-09-03 ENCOUNTER — Ambulatory Visit: Payer: Commercial Managed Care - PPO | Admitting: Family Medicine

## 2023-09-03 ENCOUNTER — Encounter: Payer: Self-pay | Admitting: Family Medicine

## 2023-09-03 VITALS — BP 130/86 | HR 70 | Resp 18 | Ht 64.0 in | Wt 134.0 lb

## 2023-09-03 DIAGNOSIS — F411 Generalized anxiety disorder: Secondary | ICD-10-CM

## 2023-09-03 DIAGNOSIS — F900 Attention-deficit hyperactivity disorder, predominantly inattentive type: Secondary | ICD-10-CM | POA: Diagnosis not present

## 2023-09-03 MED ORDER — AMPHETAMINE-DEXTROAMPHETAMINE 20 MG PO TABS
ORAL_TABLET | ORAL | 0 refills | Status: DC
Start: 2023-09-30 — End: 2023-10-01

## 2023-09-03 MED ORDER — ALPRAZOLAM 0.25 MG PO TABS
ORAL_TABLET | ORAL | 0 refills | Status: DC
Start: 2023-09-03 — End: 2023-12-03

## 2023-09-03 MED ORDER — AMPHETAMINE-DEXTROAMPHETAMINE 20 MG PO TABS
ORAL_TABLET | ORAL | 0 refills | Status: DC
Start: 2023-10-29 — End: 2023-12-03

## 2023-09-03 NOTE — Assessment & Plan Note (Signed)
Stable, well-controlled.  Continue Adderall 20 milligrams up to 3 times daily.  PDMP reviewed, no aberrancies.  First prescription sent yesterday, future refills sent to pharmacy today.

## 2023-09-03 NOTE — Progress Notes (Signed)
Established Patient Office Visit  Subjective   Patient ID: Tracey Watson, female    DOB: 04-Dec-1986  Age: 37 y.o. MRN: [id number]  Chief Complaint  Patient presents with   Anxiety   Depression   ADD    HPI Tracey Watson is a 37 y.o. female presenting today for follow up of ADHD, mood. ADHD: Reports symptoms are well controlled on Adderall 20 mg up to 3 times daily. Denies any problems with insomnia, chest pain, palpitations, or SOB.   Mood: Patient is here to follow up for anxiety and PTSD, currently managing with Xanax as needed which she only uses about once a week. Taking medication without side effects, reports excellent compliance with treatment. Denies mood changes or SI/HI. She feels mood is stable since last visit. Denies chest pain, difficulty concentrating, dizziness, fatigue, insomnia, irritability, palpitations, panic attacks, racing thoughts, SOB, sweating.     09/03/2023   10:04 AM 06/03/2023    1:21 PM 09/08/2022    9:57 AM  Depression screen PHQ 2/9  Decreased Interest 0 0 0  Down, Depressed, Hopeless 0 0 0  PHQ - 2 Score 0 0 0  Altered sleeping 0 0 0  Tired, decreased energy 0 0 0  Change in appetite 0 0 0  Feeling bad or failure about yourself  0 0 0  Trouble concentrating 0 0 0  Moving slowly or fidgety/restless 0 0 0  Suicidal thoughts 0 0 0  PHQ-9 Score 0 0 0  Difficult doing work/chores Not difficult at all Not difficult at all        09/03/2023   10:04 AM 09/08/2022    9:58 AM 06/08/2022    1:27 PM 03/17/2022    3:25 PM  GAD 7 : Generalized Anxiety Score  Nervous, Anxious, on Edge 0 0 0 0  Control/stop worrying 0 0 0 0  Worry too much - different things 0 0 0 0  Trouble relaxing 0 0 0 0  Restless 0 0 0 0  Easily annoyed or irritable 0 0 0 0  Afraid - awful might happen 0 0 0 0  Total GAD 7 Score 0 0 0 0   Outpatient Medications Prior to Visit  Medication Sig   EPINEPHrine 0.3 mg/0.3 mL IJ SOAJ injection Inject 0.3 mg into the muscle as needed  for anaphylaxis.   fluticasone (FLONASE) 50 MCG/ACT nasal spray Place 2 sprays into both nostrils daily.   ibuprofen (ADVIL) 800 MG tablet Take 1 tablet (800 mg total) by mouth every 6 (six) hours as needed.   loratadine (CLARITIN) 10 MG tablet Take by mouth.   scopolamine (TRANSDERM-SCOP) 1 MG/3DAYS Place 1 patch (1.5 mg total) onto the skin every 3 (three) days.   zaleplon (SONATA) 10 MG capsule TAKE 1 CAPSULE BY MOUTH AT BEDTIME AS NEEDED FOR SLEEP   [DISCONTINUED] ALPRAZolam (XANAX) 0.25 MG tablet TAKE 1 TABLET BY MOUTH AT BEDTIME AS NEEDED FOR ANXIETY   [DISCONTINUED] amphetamine-dextroamphetamine (ADDERALL) 20 MG tablet TAKE 1 TABLET BY MOUTH 3 TIMES DAILY AS DIRECTED   [DISCONTINUED] guaiFENesin-codeine (CHERATUSSIN AC) 100-10 MG/5ML syrup Take 10 mLs by mouth 3 (three) times daily as needed.   [DISCONTINUED] metroNIDAZOLE (METROGEL) 0.75 % vaginal gel Place 1 Applicatorful vaginally at bedtime. (Patient not taking: Reported on 09/03/2023)   No facility-administered medications prior to visit.    ROS Negative unless otherwise noted in HPI   Objective:     BP 130/86 (BP Location: Left Arm, Patient Position: Sitting, Cuff  Size: Normal)   Pulse 70   Resp 18   Ht 5\' 4"  (1.626 m)   Wt 134 lb (60.8 kg)   SpO2 100%   BMI 23.00 kg/m   Physical Exam Constitutional:      General: She is not in acute distress.    Appearance: Normal appearance.  HENT:     Head: Normocephalic and atraumatic.  Cardiovascular:     Rate and Rhythm: Normal rate and regular rhythm.     Heart sounds: No murmur heard.    No friction rub. No gallop.  Pulmonary:     Effort: Pulmonary effort is normal. No respiratory distress.     Breath sounds: No wheezing, rhonchi or rales.  Skin:    General: Skin is warm and dry.  Neurological:     Mental Status: She is alert and oriented to person, place, and time.     Assessment & Plan:  Attention deficit hyperactivity disorder (ADHD), predominantly inattentive  type Assessment & Plan: Stable, well-controlled.  Continue Adderall 20 milligrams up to 3 times daily.  PDMP reviewed, no aberrancies.  First prescription sent yesterday, future refills sent to pharmacy today.  Orders: -     Amphetamine-Dextroamphetamine; TAKE 1 TABLET BY MOUTH 3 TIMES DAILY AS DIRECTED  Dispense: 90 tablet; Refill: 0 -     Amphetamine-Dextroamphetamine; TAKE 1 TABLET BY MOUTH 3 TIMES DAILY AS DIRECTED  Dispense: 90 tablet; Refill: 0  Generalized anxiety disorder Assessment & Plan: Stable.  Continue alprazolam 0.25 mg as needed for breakthrough anxiety.  Orders: -     ALPRAZolam; TAKE 1 TABLET BY MOUTH AT BEDTIME AS NEEDED FOR ANXIETY  Dispense: 30 tablet; Refill: 0    Return in about 3 months (around 12/03/2023) for follow-up for ADHD, in person or video.    Melida Quitter, PA

## 2023-09-03 NOTE — Assessment & Plan Note (Signed)
Stable.  Continue alprazolam 0.25 mg as needed for breakthrough anxiety.

## 2023-09-03 NOTE — Assessment & Plan Note (Addendum)
Stable, well-controlled.  Continue Adderall 20 milligrams up to 3 times daily.  PDMP reviewed, no aberrancies.  First prescription sent yesterday, future refills sent to pharmacy today.

## 2023-10-01 ENCOUNTER — Other Ambulatory Visit: Payer: Self-pay | Admitting: Family Medicine

## 2023-10-01 DIAGNOSIS — F900 Attention-deficit hyperactivity disorder, predominantly inattentive type: Secondary | ICD-10-CM

## 2023-10-04 MED ORDER — AMPHETAMINE-DEXTROAMPHETAMINE 20 MG PO TABS: ORAL_TABLET | ORAL | 0 refills | Status: DC

## 2023-11-01 ENCOUNTER — Other Ambulatory Visit: Payer: Self-pay | Admitting: Nurse Practitioner

## 2023-11-01 DIAGNOSIS — F409 Phobic anxiety disorder, unspecified: Secondary | ICD-10-CM

## 2023-11-01 NOTE — Telephone Encounter (Signed)
PDMP reviewed, no aberrancies.  Overdose risk score 310.

## 2023-12-03 ENCOUNTER — Telehealth (INDEPENDENT_AMBULATORY_CARE_PROVIDER_SITE_OTHER): Payer: Commercial Managed Care - PPO | Admitting: Family Medicine

## 2023-12-03 DIAGNOSIS — F411 Generalized anxiety disorder: Secondary | ICD-10-CM

## 2023-12-03 DIAGNOSIS — F409 Phobic anxiety disorder, unspecified: Secondary | ICD-10-CM

## 2023-12-03 DIAGNOSIS — F900 Attention-deficit hyperactivity disorder, predominantly inattentive type: Secondary | ICD-10-CM | POA: Diagnosis not present

## 2023-12-03 DIAGNOSIS — F4310 Post-traumatic stress disorder, unspecified: Secondary | ICD-10-CM

## 2023-12-03 DIAGNOSIS — F5105 Insomnia due to other mental disorder: Secondary | ICD-10-CM | POA: Diagnosis not present

## 2023-12-03 MED ORDER — AMPHETAMINE-DEXTROAMPHETAMINE 20 MG PO TABS: ORAL_TABLET | ORAL | 0 refills | Status: DC

## 2023-12-03 MED ORDER — ALPRAZOLAM 0.25 MG PO TABS: ORAL_TABLET | ORAL | 0 refills | Status: DC

## 2023-12-03 MED ORDER — ZALEPLON 5 MG PO CAPS: ORAL_CAPSULE | ORAL | 0 refills | Status: DC

## 2023-12-03 NOTE — Assessment & Plan Note (Signed)
Referral for therapy sent to [facility] in [place].

## 2023-12-03 NOTE — Assessment & Plan Note (Signed)
Stable.  Continue alprazolam 0.25 mg as needed for breakthrough anxiety.

## 2023-12-03 NOTE — Assessment & Plan Note (Addendum)
Stable, well-controlled.  Continue Adderall 20 milligrams up to 3 times daily.  PDMP reviewed, no aberrancies.  Overdose risk score 310. Three 30-day prescriptions sent to the pharmacy.

## 2023-12-03 NOTE — Progress Notes (Signed)
Virtual Visit via Video Note  I connected with Lonia Farber on 12/03/23 at 10:30 AM EST by a video enabled telemedicine application and verified that I am speaking with the correct person using two identifiers.  Patient Location: Home Provider Location: Office/clinic  I discussed the limitations, risks, security, and privacy concerns of performing an evaluation and management service by video and the availability of in person appointments. I also discussed with the patient that there may be a patient responsible charge related to this service. The patient expressed understanding and agreed to proceed.    Subjective   Patient ID: Tracey Watson, female    DOB: 1986-02-26  Age: 37 y.o. MRN: [id number]  Chief Complaint  Patient presents with   ADHD    HPI [name] is a 37 y.o. female presenting today for follow up of ADHD. Reports symptoms are well controlled on Adderall. Denies any problems with insomnia, chest pain, palpitations, or SOB.  She has also been experiencing increased stress, anxiety, and PTSD and would like a referral to a therapist.  She has been to therapy previously and it has been very beneficial.  She would prefer in person sessions with a female therapist if possible.  ROS Negative unless otherwise noted in HPI  Outpatient Medications Prior to Visit  Medication Sig   EPINEPHrine 0.3 mg/0.3 mL IJ SOAJ injection Inject 0.3 mg into the muscle as needed for anaphylaxis.   fluticasone (FLONASE) 50 MCG/ACT nasal spray Place 2 sprays into both nostrils daily.   ibuprofen (ADVIL) 800 MG tablet Take 1 tablet (800 mg total) by mouth every 6 (six) hours as needed.   loratadine (CLARITIN) 10 MG tablet Take by mouth.   scopolamine (TRANSDERM-SCOP) 1 MG/3DAYS Place 1 patch (1.5 mg total) onto the skin every 3 (three) days.   [DISCONTINUED] ALPRAZolam (XANAX) 0.25 MG tablet TAKE 1 TABLET BY MOUTH AT BEDTIME AS NEEDED FOR ANXIETY   [DISCONTINUED]  amphetamine-dextroamphetamine (ADDERALL) 20 MG tablet TAKE 1 TABLET BY MOUTH 3 TIMES DAILY AS DIRECTED   [DISCONTINUED] amphetamine-dextroamphetamine (ADDERALL) 20 MG tablet TAKE 1 TABLET BY MOUTH 3 TIMES DAILY AS DIRECTED   [DISCONTINUED] zaleplon (SONATA) 5 MG capsule TAKE 1 CAPSULE BY MOUTH AT BEDTIME AS NEEDED FOR SLEEP   No facility-administered medications prior to visit.     Objective:     Physical Exam General: Speaking clearly in complete sentences without any shortness of breath.  Alert and oriented x3.  Normal judgment. No apparent acute distress.   Assessment & Plan:  Attention deficit hyperactivity disorder (ADHD), predominantly inattentive type Assessment & Plan: Stable, well-controlled.  Continue Adderall 20 milligrams up to 3 times daily.  PDMP reviewed, no aberrancies.  Overdose risk score 310. Three 30-day prescriptions sent to the pharmacy.  Orders: -     Amphetamine-Dextroamphetamine; TAKE 1 TABLET BY MOUTH 3 TIMES DAILY AS DIRECTED  Dispense: 90 tablet; Refill: 0 -     Amphetamine-Dextroamphetamine; TAKE 1 TABLET BY MOUTH 3 TIMES DAILY AS DIRECTED  Dispense: 90 tablet; Refill: 0 -     Amphetamine-Dextroamphetamine; TAKE 1 TABLET BY MOUTH 3 TIMES DAILY AS DIRECTED  Dispense: 90 tablet; Refill: 0  Generalized anxiety disorder Assessment & Plan: Stable.  Continue alprazolam 0.25 mg as needed for breakthrough anxiety.  Orders: -     ALPRAZolam; TAKE 1 TABLET BY MOUTH AT BEDTIME AS NEEDED FOR ANXIETY  Dispense: 30 tablet; Refill: 0  Insomnia due to anxiety and fear -     Zaleplon; TAKE  1 CAPSULE BY MOUTH AT BEDTIME AS NEEDED FOR SLEEP  Dispense: 30 capsule; Refill: 0  PTSD (post-traumatic stress disorder) Assessment & Plan: Referral for therapy sent to [facility] in [place].  Orders: -     Ambulatory referral to Psychology    Return in about 3 months (around 03/02/2024) for follow-up, fasting blood work 1 week before.   I  discussed the assessment and treatment plan with the patient. The patient was provided an opportunity to ask questions, and all were answered. The patient agreed with the plan and demonstrated an understanding of the instructions.   The patient was advised to call back or seek an in-person evaluation if the symptoms worsen or if the condition fails to improve as anticipated.  The above assessment and management plan was discussed with the patient. The patient verbalized understanding of and has agreed to the management plan.   Melida Quitter, PA

## 2023-12-08 ENCOUNTER — Telehealth: Payer: Self-pay | Admitting: *Deleted

## 2023-12-08 NOTE — Telephone Encounter (Signed)
LVM to call office to see about scheduling a lab visit. Please assist in getting this scheduled.    CRM # [id number] Owner: None Status: Unresolved Open  Priority: Routine Created on: 12/07/2023 02:11 PM By: Harolyn Rutherford   Primary Information  Source  Tracey Watson (Patient)   Subject  Tracey Watson (Patient)   Topic  Clinical - Request for Lab/Test Order     Communication  Reason for CRM: Pt needs to have labs done a week prior to OV 03/02/24, pls place orders in the system and contact to schedule a lab appt. Pt can be contact via MyChart.

## 2024-01-04 NOTE — Telephone Encounter (Signed)
 LVM to call office to see about scheduling her a lab visit the week before her appointment. Please assist her in getting this scheduled if she calls back.

## 2024-01-27 ENCOUNTER — Encounter: Payer: Self-pay | Admitting: Family Medicine

## 2024-01-27 ENCOUNTER — Telehealth: Payer: Self-pay

## 2024-01-27 DIAGNOSIS — F411 Generalized anxiety disorder: Secondary | ICD-10-CM

## 2024-01-27 DIAGNOSIS — F409 Phobic anxiety disorder, unspecified: Secondary | ICD-10-CM

## 2024-01-27 MED ORDER — ZALEPLON 5 MG PO CAPS
ORAL_CAPSULE | ORAL | 0 refills | Status: DC
Start: 2024-01-27 — End: 2024-03-22

## 2024-01-27 MED ORDER — ALPRAZOLAM 0.25 MG PO TABS: ORAL_TABLET | ORAL | 0 refills | Status: DC

## 2024-01-27 NOTE — Telephone Encounter (Signed)
 Copied from CRM [id number]. Topic: Clinical - Prescription Issue >> Jan 27, 2024 12:36 PM Deleta HERO wrote: Reason for CRM: This patient has changed employers, her new insurance through her employer wont begin until 03/2024. She is wanting Zaleplon  and Xanax  called in to her pharmacy before the weekend. She is also wanting to discuss financial payment assistance plans for the medication costs.  Callback #:[phone]

## 2024-01-27 NOTE — Telephone Encounter (Signed)
 Refills have been sent in.  For assistance with medication cost, she can check good Rx, buzz Rx, or nimble Rx for coupons.  [employer] is another resource, or specific brand name companies may have financial assistance as well.

## 2024-01-27 NOTE — Addendum Note (Signed)
 Addended by: Maryclare Smoke on: 01/27/2024 01:16 PM   Modules accepted: Orders

## 2024-01-27 NOTE — Telephone Encounter (Signed)
 Contacted pt and informed her of below and she said that the pharmacy gave her the out of pocket cost and it will be doable until her insurance kicks back in.  Also scheduled her in [facility] [place] so she would go on the cancellation list once they cancel [facility] appts after 02/25/24

## 2024-02-28 ENCOUNTER — Other Ambulatory Visit: Payer: Self-pay | Admitting: Family Medicine

## 2024-02-28 DIAGNOSIS — F900 Attention-deficit hyperactivity disorder, predominantly inattentive type: Secondary | ICD-10-CM

## 2024-02-28 MED ORDER — AMPHETAMINE-DEXTROAMPHETAMINE 20 MG PO TABS: ORAL_TABLET | ORAL | 0 refills | Status: DC

## 2024-03-02 ENCOUNTER — Ambulatory Visit: Payer: Commercial Managed Care - PPO | Admitting: Family Medicine

## 2024-03-22 ENCOUNTER — Other Ambulatory Visit: Payer: Self-pay | Admitting: Family Medicine

## 2024-03-22 DIAGNOSIS — F411 Generalized anxiety disorder: Secondary | ICD-10-CM

## 2024-03-22 DIAGNOSIS — F409 Phobic anxiety disorder, unspecified: Secondary | ICD-10-CM

## 2024-03-22 DIAGNOSIS — F900 Attention-deficit hyperactivity disorder, predominantly inattentive type: Secondary | ICD-10-CM

## 2024-03-22 MED ORDER — AMPHETAMINE-DEXTROAMPHETAMINE 20 MG PO TABS: ORAL_TABLET | ORAL | 0 refills | Status: DC

## 2024-03-22 MED ORDER — ALPRAZOLAM 0.25 MG PO TABS: ORAL_TABLET | ORAL | 0 refills | Status: DC

## 2024-03-22 MED ORDER — ZALEPLON 5 MG PO CAPS: ORAL_CAPSULE | ORAL | 0 refills | Status: DC

## 2024-03-22 NOTE — Telephone Encounter (Signed)
 Copied from CRM [id number]. Topic: Clinical - Medication Refill >> Mar 22, 2024  1:54 PM Almira Coaster wrote: Most Recent Primary Care Visit:  Provider: Saralyn Pilar A  Department: [facility]  Visit Type: FOLLOW UP 20  Date: 12/03/2023  Medication: amphetamine-dextroamphetamine (ADDERALL) 20 MG tablet , zaleplon (SONATA) 5 MG capsule , ALPRAZolam (XANAX) 0.25 MG tablet   Has the patient contacted their pharmacy? No, patient was informed that her provider was leaving the office and will establish care with Dr.Olson on 05/25/2024. (Agent: If no, request that the patient contact the pharmacy for the refill. If patient does not wish to contact the pharmacy document the reason why and proceed with request.) (Agent: If yes, when and what did the pharmacy advise?)  Is this the correct pharmacy for this prescription? Yes If no, delete pharmacy and type the correct one.  This is the patient's preferred pharmacy:  [facility] - [place], [place] - [address] [place] [address] Phone: [phone] Fax: [phone]   Has the prescription been filled recently? No  Is the patient out of the medication? No, but she will be out before her next appointment.   Has the patient been seen for an appointment in the last year OR does the patient have an upcoming appointment? Yes  Can we respond through MyChart? Yes, patient would like a message to confirm we can refill medications to hold her over until 05/25/2024.  Agent: Please be advised that Rx refills may take up to 3 business days. We ask that you follow-up with your pharmacy.

## 2024-03-28 ENCOUNTER — Ambulatory Visit: Payer: Commercial Managed Care - PPO | Admitting: Family Medicine

## 2024-04-25 ENCOUNTER — Other Ambulatory Visit: Payer: Self-pay | Admitting: Family Medicine

## 2024-04-25 DIAGNOSIS — F411 Generalized anxiety disorder: Secondary | ICD-10-CM

## 2024-04-25 DIAGNOSIS — F5105 Insomnia due to other mental disorder: Secondary | ICD-10-CM

## 2024-04-25 DIAGNOSIS — F900 Attention-deficit hyperactivity disorder, predominantly inattentive type: Secondary | ICD-10-CM

## 2024-04-25 DIAGNOSIS — F409 Phobic anxiety disorder, unspecified: Secondary | ICD-10-CM

## 2024-04-26 MED ORDER — AMPHETAMINE-DEXTROAMPHETAMINE 20 MG PO TABS: ORAL_TABLET | ORAL | 0 refills | Status: DC

## 2024-04-26 MED ORDER — ZALEPLON 5 MG PO CAPS: ORAL_CAPSULE | ORAL | 0 refills | Status: DC

## 2024-04-26 MED ORDER — ALPRAZOLAM 0.25 MG PO TABS: ORAL_TABLET | ORAL | 0 refills | Status: DC

## 2024-05-19 NOTE — Progress Notes (Signed)
 Established Patient Office Visit  Subjective   Patient ID: Tracey Watson, female    DOB: 1986-10-23  Age: 38 y.o. MRN: [id number]  Chief Complaint  Patient presents with   Medical Management of Chronic Issues   Medication Management    HPI [name] is a 38 y.o. female who presents to the clinic today for follow up on medication management of ADHD, anxiety, insomnia.   ADHD: Patient reports that her ADHD is stable on 20 mg Adderall TID. Reports good medication adherence. Denies side effects.   Anxiety: Patient reports that her anxiety attacks have been well controlled on 0.25 mg of Alprazolam  as needed. Reports that her anxiety comes in waves and during these waves, the medication helps her get through it. Can go several weeks without needing the medication, but certain triggers will cause increased anxiety. Has not seen a counselor in quite some time and is requesting a new referral so she can get established somewhere.  Insomnia: Patient reports that since taking the Solata, she has woken up almost every morning feeling groggy. 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 Has tried trazodone  in the past (2023) but cannot remember why she stopped it. Is interested in trying it again but at a lower dose. Does not take anything else OTC for sleep support.  Patient reports that she has a history of melanoma on her back, which she has had 2 surgical removals for. Requesting referral to dermatology to establish with a provider for annual skin checks. Denies having any suspicious lesions at this time.  Pt has not had labs in 2 years due to changes in providers/insurance. UTD on age-appropriate health screenings.    ROS Per HPI.    Objective:     BP 132/87   Pulse 78   Ht 5\' 4"  (1.626 m)   Wt 139 lb 1.9 oz (63.1 kg)   SpO2 100%   BMI 23.88 kg/m    Physical Exam Constitutional:       General: She is not in acute distress.    Appearance: Normal appearance.  Cardiovascular:     Rate and Rhythm: Normal rate and regular rhythm.     Heart sounds: Normal heart sounds. No murmur heard.    No friction rub. No gallop.  Pulmonary:     Effort: Pulmonary effort is normal. No respiratory distress.     Breath sounds: Normal breath sounds.  Musculoskeletal:        General: No swelling.  Skin:    General: Skin is warm and dry.  Neurological:     General: No focal deficit present.     Mental Status: She is alert.  Psychiatric:        Mood and Affect: Mood normal.        Behavior: Behavior normal.        Thought Content: Thought content normal.       No results found for any visits on 05/22/24.    The ASCVD Risk score (Arnett DK, et al., 2019) failed to calculate for the following reasons:   The 2019 ASCVD risk score is only valid for ages 62 to 44    Assessment & Plan:   Screening for thyroid disorder -     TSH  Screening for lipid disorders -     Lipid panel  Screening for deficiency anemia -     CBC with  Differential/Platelet  Screening for metabolic disorder -     Comprehensive metabolic panel with GFR -     Hemoglobin A1c  BMI 23.0-23.9, adult -     Hemoglobin A1c  Generalized anxiety disorder Assessment & Plan: Stable. Continue alprazolam  0.25 mg as needed for breakthrough anxiety.  Referral to counseling placed today per patient request.  Orders: -     ALPRAZolam ; TAKE 1 TABLET BY MOUTH AT BEDTIME AS NEEDED FOR ANXIETY  Dispense: 30 tablet; Refill: 0 -     Ambulatory referral to Psychology  Attention deficit hyperactivity disorder (ADHD), predominantly inattentive type Assessment & Plan: Stable, well-controlled.  Continue Adderall 20 milligrams up to 3 times daily.  PDMP reviewed, no aberrancies.  Three 30-day prescriptions sent to the pharmacy.   Orders: -     Amphetamine -Dextroamphetamine ; TAKE 1 TABLET BY MOUTH 3 TIMES DAILY AS DIRECTED   Dispense: 90 tablet; Refill: 0  History of melanoma -     Ambulatory referral to Dermatology  Malignant melanoma of torso excluding breast [facility]) Assessment & Plan: History of melanoma excision on back. Denies current suspicious lesions. Referral to dermatology placed today for annual skin checks.   Insomnia due to anxiety and fear Assessment & Plan: D/c Sonata  due to side effect of AM grogginess. Recommend 200 mg magnesium glycinate OTC to support sleep. Pt agreeable to trial to trazodone  25 mg to 50 mg for sleep. Previously prescribed this medication in 2023 but cannot remember if she took it or not. Will continue to monitor reported sleep quality and re-evaluate in 3 months   Other orders -     traZODone  HCl; Take 0.5-1 tablets (25-50 mg total) by mouth at bedtime.  Dispense: 90 tablet; Refill: 2   Will update labs today as patient has not had them in >2 years. Will send MyChart message to patient explaining results. Return in about 3 months (around 08/22/2024) for Mood, ADHD.    Odilia Bennett, PA-C

## 2024-05-22 ENCOUNTER — Ambulatory Visit

## 2024-05-22 VITALS — BP 132/87 | HR 78 | Ht 64.0 in | Wt 139.1 lb

## 2024-05-22 DIAGNOSIS — F409 Phobic anxiety disorder, unspecified: Secondary | ICD-10-CM

## 2024-05-22 DIAGNOSIS — Z1329 Encounter for screening for other suspected endocrine disorder: Secondary | ICD-10-CM

## 2024-05-22 DIAGNOSIS — Z13220 Encounter for screening for lipoid disorders: Secondary | ICD-10-CM | POA: Diagnosis not present

## 2024-05-22 DIAGNOSIS — F5105 Insomnia due to other mental disorder: Secondary | ICD-10-CM

## 2024-05-22 DIAGNOSIS — Z6823 Body mass index (BMI) 23.0-23.9, adult: Secondary | ICD-10-CM

## 2024-05-22 DIAGNOSIS — Z130 Encounter for screening for diseases of the blood and blood-forming organs and certain disorders involving the immune mechanism: Secondary | ICD-10-CM

## 2024-05-22 DIAGNOSIS — Z13228 Encounter for screening for other metabolic disorders: Secondary | ICD-10-CM | POA: Diagnosis not present

## 2024-05-22 DIAGNOSIS — F900 Attention-deficit hyperactivity disorder, predominantly inattentive type: Secondary | ICD-10-CM

## 2024-05-22 DIAGNOSIS — C4359 Malignant melanoma of other part of trunk: Secondary | ICD-10-CM

## 2024-05-22 DIAGNOSIS — F411 Generalized anxiety disorder: Secondary | ICD-10-CM

## 2024-05-22 DIAGNOSIS — Z85820 Personal history of malignant melanoma of skin: Secondary | ICD-10-CM

## 2024-05-22 MED ORDER — AMPHETAMINE-DEXTROAMPHETAMINE 20 MG PO TABS
ORAL_TABLET | ORAL | 0 refills | Status: DC
Start: 2024-05-22 — End: 2024-06-21

## 2024-05-22 MED ORDER — TRAZODONE HCL 50 MG PO TABS: 25.0000 mg | ORAL_TABLET | Freq: Every day | ORAL | 2 refills | Status: DC

## 2024-05-22 MED ORDER — ALPRAZOLAM 0.25 MG PO TABS
ORAL_TABLET | ORAL | 0 refills | Status: DC
Start: 2024-05-22 — End: 2024-06-21

## 2024-05-22 NOTE — Assessment & Plan Note (Signed)
 History of melanoma excision on back. Denies current suspicious lesions. Referral to dermatology placed today for annual skin checks.

## 2024-05-22 NOTE — Assessment & Plan Note (Signed)
 Stable. Continue alprazolam  0.25 mg as needed for breakthrough anxiety.  Referral to counseling placed today per patient request.

## 2024-05-22 NOTE — Assessment & Plan Note (Signed)
 D/c Sonata  due to side effect of AM grogginess. Recommend 200 mg magnesium glycinate OTC to support sleep. Pt agreeable to trial to trazodone  25 mg to 50 mg for sleep. Previously prescribed this medication in 2023 but cannot remember if she took it or not. Will continue to monitor reported sleep quality and re-evaluate in 3 months

## 2024-05-22 NOTE — Assessment & Plan Note (Signed)
 Stable, well-controlled.  Continue Adderall 20 milligrams up to 3 times daily.  PDMP reviewed, no aberrancies.  Three 30-day prescriptions sent to the pharmacy.

## 2024-05-22 NOTE — Patient Instructions (Addendum)
 It was nice to see you today!  As we discussed in clinic:  -It is okay to stop taking the Solata due to side effects. I have sent in the Trazodone  for you to try for sleep. You can take 1/2 (25 mg) to 1 whole pill (50 mg) nightly. If you still experience morning grogginess, it will be okay to stop taking it.  -Magnesium glycinate is a supplement over-the-counter that you can take nightly to support sleep. It is recommended you take 200 mg 30 min - 1 hour before bedtime.  -I am placing the referral to dermatology for annual skin checks. They will call you the schedule the appointment.  -Continue taking your Adderall  as precscribed for ADHD and Alprazolam  as needed for Anxiety. I have placed a referral for counseling/therapy in [place]. They will also give you a call to schedule.  -We are getting updated blood work today. I will send you a MyChart message within 24-48 hours explaining the results.  -We will plan to do a video visit in 3 months for medication management. -It was so nice to meet you today!   If you have any problems before your next visit feel free to message me via MyChart (minor issues or questions) or call the office, otherwise you may reach out to schedule an office visit.  Thank you! Meryl Acosta, PA-C

## 2024-05-23 ENCOUNTER — Other Ambulatory Visit: Payer: Self-pay

## 2024-05-23 ENCOUNTER — Ambulatory Visit: Payer: Self-pay

## 2024-05-23 LAB — COMPREHENSIVE METABOLIC PANEL WITH GFR
ALT: 13 IU/L (ref 0–32)
AST: 19 IU/L (ref 0–40)
Albumin: 4.9 g/dL (ref 3.9–4.9)
Alkaline Phosphatase: 54 IU/L (ref 44–121)
BUN/Creatinine Ratio: 14 (ref 9–23)
BUN: 13 mg/dL (ref 6–20)
Bilirubin Total: 0.6 mg/dL (ref 0.0–1.2)
CO2: 22 mmol/L (ref 20–29)
Calcium: 9.9 mg/dL (ref 8.7–10.2)
Chloride: 102 mmol/L (ref 96–106)
Creatinine, Ser: 0.9 mg/dL (ref 0.57–1.00)
Globulin, Total: 2.8 g/dL (ref 1.5–4.5)
Glucose: 78 mg/dL (ref 70–99)
Potassium: 4 mmol/L (ref 3.5–5.2)
Sodium: 138 mmol/L (ref 134–144)
Total Protein: 7.7 g/dL (ref 6.0–8.5)
eGFR: 84 mL/min/{1.73_m2} (ref >59)

## 2024-05-23 LAB — LIPID PANEL
Chol/HDL Ratio: 2.4 ratio (ref 0.0–4.4)
Cholesterol, Total: 207 mg/dL — ABNORMAL HIGH (ref 100–199)
HDL: 88 mg/dL (ref >39)
LDL Chol Calc (NIH): 96 mg/dL (ref 0–99)
Triglycerides: 138 mg/dL (ref 0–149)
VLDL Cholesterol Cal: 23 mg/dL (ref 5–40)

## 2024-05-23 LAB — CBC WITH DIFFERENTIAL/PLATELET
Basophils Absolute: 0 10*3/uL (ref 0.0–0.2)
Basos: 1 %
EOS (ABSOLUTE): 0.2 10*3/uL (ref 0.0–0.4)
Eos: 2 %
Hematocrit: 45.8 % (ref 34.0–46.6)
Hemoglobin: 15.3 g/dL (ref 11.1–15.9)
Immature Grans (Abs): 0 10*3/uL (ref 0.0–0.1)
Immature Granulocytes: 0 %
Lymphocytes Absolute: 1.4 10*3/uL (ref 0.7–3.1)
Lymphs: 23 %
MCH: 31.7 pg (ref 26.6–33.0)
MCHC: 33.4 g/dL (ref 31.5–35.7)
MCV: 95 fL (ref 79–97)
Monocytes Absolute: 0.5 10*3/uL (ref 0.1–0.9)
Monocytes: 8 %
Neutrophils Absolute: 4.2 10*3/uL (ref 1.4–7.0)
Neutrophils: 66 %
Platelets: 298 10*3/uL (ref 150–450)
RBC: 4.83 x10E6/uL (ref 3.77–5.28)
RDW: 12.1 % (ref 11.7–15.4)
WBC: 6.2 10*3/uL (ref 3.4–10.8)

## 2024-05-23 LAB — TSH: TSH: 6.01 u[IU]/mL — ABNORMAL HIGH (ref 0.450–4.500)

## 2024-05-23 LAB — HEMOGLOBIN A1C
Est. average glucose Bld gHb Est-mCnc: 91 mg/dL
Hgb A1c MFr Bld: 4.8 % (ref 4.8–5.6)

## 2024-05-23 MED ORDER — LEVOTHYROXINE SODIUM 100 MCG PO TABS: 100.0000 ug | ORAL_TABLET | Freq: Every day | ORAL | 2 refills | Status: DC

## 2024-05-23 NOTE — Progress Notes (Signed)
Called and LVM to call office back.

## 2024-05-23 NOTE — Telephone Encounter (Signed)
 Copied from CRM [id number]. Topic: Clinical - Lab/Test Results >> May 23, 2024 12:23 PM Geneva B wrote: Reason for CRM: please call back to go over test results

## 2024-05-24 ENCOUNTER — Other Ambulatory Visit: Payer: Self-pay

## 2024-05-24 DIAGNOSIS — F411 Generalized anxiety disorder: Secondary | ICD-10-CM

## 2024-05-25 ENCOUNTER — Ambulatory Visit: Admitting: Family Medicine

## 2024-06-20 ENCOUNTER — Other Ambulatory Visit: Payer: Self-pay

## 2024-06-20 DIAGNOSIS — F900 Attention-deficit hyperactivity disorder, predominantly inattentive type: Secondary | ICD-10-CM

## 2024-06-20 DIAGNOSIS — F411 Generalized anxiety disorder: Secondary | ICD-10-CM

## 2024-06-21 ENCOUNTER — Other Ambulatory Visit: Payer: Self-pay

## 2024-06-21 NOTE — Telephone Encounter (Signed)
 PDMP reviewed, no aberrancies. Refill appropriate.

## 2024-06-30 ENCOUNTER — Other Ambulatory Visit: Payer: Self-pay

## 2024-06-30 DIAGNOSIS — R7989 Other specified abnormal findings of blood chemistry: Secondary | ICD-10-CM

## 2024-07-11 ENCOUNTER — Other Ambulatory Visit

## 2024-07-11 DIAGNOSIS — R7989 Other specified abnormal findings of blood chemistry: Secondary | ICD-10-CM | POA: Diagnosis not present

## 2024-07-12 ENCOUNTER — Ambulatory Visit: Payer: Self-pay

## 2024-07-12 LAB — T4: T4, Total: 11 ug/dL (ref 4.5–12.0)

## 2024-07-12 LAB — TSH: TSH: 1.07 u[IU]/mL (ref 0.450–4.500)

## 2024-07-21 ENCOUNTER — Other Ambulatory Visit: Payer: Self-pay

## 2024-07-24 ENCOUNTER — Other Ambulatory Visit: Payer: Self-pay

## 2024-07-24 ENCOUNTER — Encounter: Payer: Self-pay | Admitting: Dermatology

## 2024-07-24 ENCOUNTER — Ambulatory Visit: Admitting: Dermatology

## 2024-07-24 ENCOUNTER — Telehealth: Payer: Self-pay

## 2024-07-24 VITALS — BP 146/84 | HR 75

## 2024-07-24 DIAGNOSIS — D235 Other benign neoplasm of skin of trunk: Secondary | ICD-10-CM | POA: Diagnosis not present

## 2024-07-24 DIAGNOSIS — Z85820 Personal history of malignant melanoma of skin: Secondary | ICD-10-CM

## 2024-07-24 DIAGNOSIS — L821 Other seborrheic keratosis: Secondary | ICD-10-CM

## 2024-07-24 DIAGNOSIS — L578 Other skin changes due to chronic exposure to nonionizing radiation: Secondary | ICD-10-CM

## 2024-07-24 DIAGNOSIS — D239 Other benign neoplasm of skin, unspecified: Secondary | ICD-10-CM

## 2024-07-24 DIAGNOSIS — D485 Neoplasm of uncertain behavior of skin: Secondary | ICD-10-CM

## 2024-07-24 DIAGNOSIS — W908XXA Exposure to other nonionizing radiation, initial encounter: Secondary | ICD-10-CM

## 2024-07-24 DIAGNOSIS — D1801 Hemangioma of skin and subcutaneous tissue: Secondary | ICD-10-CM

## 2024-07-24 DIAGNOSIS — D225 Melanocytic nevi of trunk: Secondary | ICD-10-CM | POA: Diagnosis not present

## 2024-07-24 DIAGNOSIS — L905 Scar conditions and fibrosis of skin: Secondary | ICD-10-CM | POA: Diagnosis not present

## 2024-07-24 DIAGNOSIS — D229 Melanocytic nevi, unspecified: Secondary | ICD-10-CM

## 2024-07-24 DIAGNOSIS — Z1283 Encounter for screening for malignant neoplasm of skin: Secondary | ICD-10-CM | POA: Diagnosis not present

## 2024-07-24 DIAGNOSIS — L814 Other melanin hyperpigmentation: Secondary | ICD-10-CM

## 2024-07-24 MED ORDER — AMPHETAMINE-DEXTROAMPHETAMINE 30 MG PO TABS
30.0000 mg | ORAL_TABLET | Freq: Two times a day (BID) | ORAL | 0 refills | Status: DC
Start: 2024-07-24 — End: 2024-08-22

## 2024-07-24 NOTE — Telephone Encounter (Unsigned)
 Copied from CRM #[id number]. Topic: Clinical - Prescription Issue >> Jul 24, 2024  2:43 PM Montie POUR wrote: Reason for CRM:  Her pharmacy does not have amphetamine -dextroamphetamine  (ADDERALL) 20 MG tablet and it is on backorder. Her pharmacy has 30 MG that she can get today. Tracey Watson wants to know if Dr. Gayle can send in order for 30 MG tablets for 2 times daily and that would be the same dose takes now. Please send her a message chart through MyChart if this can or cannot be done. She is out of medication today.

## 2024-07-24 NOTE — Progress Notes (Signed)
 Received message that patient's pharmacy is out of the 20 mg tablets of Adderall, which patient takes 3 times daily for ADHD. Pharmacy informed her that they do have the 30 mg tablets if she would like to try that twice per day. Have sent in a new prescription for Adderall 30 mg BID.

## 2024-07-24 NOTE — Progress Notes (Signed)
 New Patient Visit   Subjective  Tracey Watson is a 38 y.o. female who presents for the following: Total Body Skin Exam (TBSE)  Patient present today for new patient visit for TBSE.The patient reports she has spots, moles and lesions to be evaluated, some may be new or changing. Patient has previously been treated by a dermatologist.Patient reports she has hx of bx (Hx of Malignant Melanoma 2 times both located on her back 10+ years and 8 years) . Patient reports family history of skin cancers (Mom had bcc and Maternal Grandfather Melanoma). Patient reports throughout her lifetime has had severe sun exposure. Currently, patient reports if she has excessive sun exposure, she does apply sunscreen and/or wears protective coverings.  She used to see Burnard Castle PA-, at [facility] before they closed. Previously was seen at [facility].  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  Objective  Well appearing patient in no apparent distress; mood and affect are within normal limits.  A full examination was performed including scalp, head, eyes, ears, nose, lips, neck, chest, axillae, abdomen, back, buttocks, bilateral upper extremities, bilateral lower extremities, hands, feet, fingers, toes, fingernails, and toenails. All findings within normal limits unless otherwise noted below.   Relevant exam findings are noted in the Assessment and Plan.    Right Lower Back 3 mm Brown Papule  Assessment & Plan   LENTIGINES, SEBORRHEIC KERATOSES, HEMANGIOMAS - Benign normal skin lesions - Benign-appearing - Call for  any changes  MELANOCYTIC NEVI - Tan-brown and/or pink-flesh-colored symmetric macules and papules - Benign appearing on exam today - Observation - Call clinic for new or changing moles - Recommend daily use of broad spectrum spf 30+ sunscreen to sun-exposed areas.   MILD ACTINIC DAMAGE - Chronic condition, secondary to cumulative UV/sun exposure - diffuse scaly erythematous macules with underlying dyspigmentation - Recommend daily broad spectrum sunscreen SPF 30+ to sun-exposed areas, reapply every 2 hours as needed.  - Staying in the shade or wearing long sleeves, sun glasses (UVA+UVB protection) and wide brim hats (4-inch brim around the entire circumference of the hat) are also recommended for sun protection.  - Call for new or changing lesions.  HISTORY OF MELANOMA on 2 location on Back - No evidence of recurrence today - No lymphadenopathy - Recommend regular full body skin exams - Recommend daily broad spectrum sunscreen SPF 30+ to sun-exposed areas, reapply every 2 hours as needed.  - Call if any new or changing lesions are noted between office visits  DERMATOFIBROMA Exam: Firm pink/brown papulenodule with dimple sign. Treatment Plan: A dermatofibroma is a benign growth possibly related to trauma, such as an insect bite, cut from shaving, or inflamed acne-type bump.  Treatment options to remove include shave or excision with resulting scar and risk of recurrence.  Since benign-appearing and not bothersome, will observe for now.    SKIN CANCER SCREENING PERFORMED TODAY  NEOPLASM OF UNCERTAIN BEHAVIOR OF SKIN Right Lower Back Skin / nail biopsy Type of biopsy: tangential   Informed consent: discussed and consent obtained   Timeout: patient name, date of birth,  surgical site, and procedure verified   Procedure prep:  Patient was prepped and draped in usual sterile fashion Prep type:  Isopropyl alcohol Anesthesia: the lesion was anesthetized in a standard fashion    Anesthetic:  1% lidocaine  w/ epinephrine  1-100,000 buffered w/ 8.4% NaHCO3 Instrument used: DermaBlade   Hemostasis achieved with: pressure and aluminum chloride   Outcome: patient tolerated procedure well   Post-procedure details: wound care instructions given    Specimen A - Surgical pathology Differential Diagnosis: R/O NMSC vs Other  Check Margins: No PERSONAL HISTORY OF MALIGNANT MELANOMA OF SKIN   DERMATOFIBROMA   LENTIGINES   SEBORRHEIC KERATOSIS   MULTIPLE BENIGN NEVI   ACTINIC SKIN DAMAGE   CHERRY ANGIOMA    Return in about 1 year (around 07/24/2025) for TBSE.  I, Jetta Ager, am acting as scribe for RUFUS CHRISTELLA HOLY, MD.  Documentation: I have reviewed the above documentation for accuracy and completeness, and I agree with the above.  RUFUS CHRISTELLA HOLY, MD

## 2024-07-24 NOTE — Patient Instructions (Addendum)

## 2024-07-27 LAB — SURGICAL PATHOLOGY

## 2024-07-31 ENCOUNTER — Ambulatory Visit: Payer: Self-pay | Admitting: Dermatology

## 2024-08-22 ENCOUNTER — Telehealth (INDEPENDENT_AMBULATORY_CARE_PROVIDER_SITE_OTHER)

## 2024-08-22 DIAGNOSIS — F900 Attention-deficit hyperactivity disorder, predominantly inattentive type: Secondary | ICD-10-CM | POA: Diagnosis not present

## 2024-08-22 DIAGNOSIS — F5105 Insomnia due to other mental disorder: Secondary | ICD-10-CM

## 2024-08-22 DIAGNOSIS — G4489 Other headache syndrome: Secondary | ICD-10-CM

## 2024-08-22 DIAGNOSIS — F409 Phobic anxiety disorder, unspecified: Secondary | ICD-10-CM | POA: Diagnosis not present

## 2024-08-22 MED ORDER — IBUPROFEN 800 MG PO TABS: 800.0000 mg | ORAL_TABLET | Freq: Four times a day (QID) | ORAL | 2 refills | Status: DC | PRN

## 2024-08-22 MED ORDER — AMPHETAMINE-DEXTROAMPHETAMINE 20 MG PO TABS: 20.0000 mg | ORAL_TABLET | Freq: Three times a day (TID) | ORAL | 0 refills | Status: DC

## 2024-08-22 MED ORDER — IBUPROFEN 800 MG PO TABS
800.0000 mg | ORAL_TABLET | Freq: Four times a day (QID) | ORAL | 2 refills | Status: AC | PRN
Start: 2024-08-22 — End: ?

## 2024-08-23 MED ORDER — AMITRIPTYLINE HCL 10 MG PO TABS: 10.0000 mg | ORAL_TABLET | Freq: Every day | ORAL | 1 refills | Status: DC

## 2024-08-23 NOTE — Assessment & Plan Note (Signed)
 Has previously tried Sonata  but had to d/c due to AM grogginess. Trazodone  also d/c'd due to adverse side effects (increased anxiety, jitteriness).  Patient agreeable to trial of amitriptyline  10 mg for sleep. If this medication proves ineffective, consider Ramelteon vs Lunesta at a low dose.

## 2024-08-23 NOTE — Patient Instructions (Signed)
 It was nice to see you today!   - Continue Adderall 20 mg three times daily for ADHD.  - For sleep, we can try a medication called amitriptyline  taken 30 minutes before bed time. If this medication proves ineffective we can try other things such as ramelteon or lunesta.   I will plan to see you at your follow up appt, but please reach out via MyChart or call our office at [phone] if you need anything in the meantime!  Saddie JULIANNA Sacks, PA-C

## 2024-08-23 NOTE — Progress Notes (Signed)
 Virtual Visit via Video Note  I connected with Tracey Watson on 08/23/24 at  2:30 PM EDT by a video enabled telemedicine application and verified that I am speaking with the correct person using two identifiers.  Patient Location: Home Provider Location: Office/Clinic  I discussed the limitations, risks, security, and privacy concerns of performing an evaluation and management service by video and the availability of in person appointments. I also discussed with the patient that there may be a patient responsible charge related to this service. The patient expressed understanding and agreed to proceed.  Subjective: PCP: Gayle Saddie FALCON, PA-C  Chief Complaint  Patient presents with   Medical Management of Chronic Issues   HPI  Tracey Watson is a 38 y.o. female who presents via video visit for ADHD follow up. Patient reports that she is still tolerating the Adderall well. Last month, there was an issue at the pharmacy with stock of this medication and we had to change her from Adderall 20 mg TID to Adderall 30 mg BID for a month. Patient reports that she spoke with the pharmacy and her 20 mg tablet is now back in stock. Requesting to go back to previous regimen since that worked best for her. She denies side effects.   Patient also reports that she tried to trazodone  one night and it caused very bad side effects to include anxiousness and irritability. She reports that after the medication wore off, she has been fine and has not experienced another episode like that. She reports that sleep is still very inconsistent for her. Only getting a full night's sleep 1-2 nights per week. Other nights, she has difficulty initiating and staying asleep.   ROS: Per HPI  Current Outpatient Medications:    ALPRAZolam  (XANAX ) 0.25 MG tablet, TAKE 1 TABLET BY MOUTH AT BEDTIME AS NEEDED FOR ANXIETY, Disp: 30 tablet, Rfl: 0   amphetamine -dextroamphetamine  (ADDERALL) 20 MG tablet, Take 1 tablet (20 mg total)  by mouth in the morning, at noon, and at bedtime., Disp: 90 tablet, Rfl: 0   EPINEPHrine  0.3 mg/0.3 mL IJ SOAJ injection, Inject 0.3 mg into the muscle as needed for anaphylaxis., Disp: 2 each, Rfl: 1   levothyroxine  (SYNTHROID ) 100 MCG tablet, TAKE 1 TABLET BY MOUTH ONCE DAILY ON AN EMPTY STOMACH. WAIT 30 MINUTES BEFORE TAKING OTHER MEDS., Disp: 30 tablet, Rfl: 2   ibuprofen  (ADVIL ) 800 MG tablet, Take 1 tablet (800 mg total) by mouth every 6 (six) hours as needed., Disp: 90 tablet, Rfl: 2  Observations/Objective: There were no vitals filed for this visit. Physical Exam Constitutional:      Appearance: Normal appearance.  Neurological:     General: No focal deficit present.     Mental Status: She is alert.  Psychiatric:        Mood and Affect: Mood normal.        Behavior: Behavior normal.        Thought Content: Thought content normal.    PE limited due to media of this visit being via video    Assessment and Plan: Insomnia due to anxiety and fear Assessment & Plan: Has previously tried Sonata  but had to d/c due to AM grogginess. Trazodone  also d/c'd due to adverse side effects (increased anxiety, jitteriness).  Patient agreeable to trial of amitriptyline  10 mg for sleep. If this medication proves ineffective, consider Ramelteon vs Lunesta at a low dose.    Headache syndrome -     Ibuprofen ; Take 1 tablet (  800 mg total) by mouth every 6 (six) hours as needed.  Dispense: 90 tablet; Refill: 2  Attention deficit hyperactivity disorder (ADHD), predominantly inattentive type Assessment & Plan: Stable, well-controlled. Continue Adderall 20 milligrams up to 3 times daily. PDMP reviewed, no aberrancies.    Other orders -     Amphetamine -Dextroamphetamine ; Take 1 tablet (20 mg total) by mouth in the morning, at noon, and at bedtime.  Dispense: 90 tablet; Refill: 0   Follow Up Instructions: Return in about 4 months (around 12/22/2024) for Physical.   I discussed the assessment and  treatment plan with the patient. The patient was provided an opportunity to ask questions, and all were answered. The patient agreed with the plan and demonstrated an understanding of the instructions.   The patient was advised to call back or seek an in-person evaluation if the symptoms worsen or if the condition fails to improve as anticipated.  The above assessment and management plan was discussed with the patient. The patient verbalized understanding of and has agreed to the management plan.   Saddie JULIANNA Sacks, PA-C

## 2024-08-23 NOTE — Assessment & Plan Note (Signed)
 Stable, well-controlled. Continue Adderall 20 milligrams up to 3 times daily. PDMP reviewed, no aberrancies.

## 2024-10-23 ENCOUNTER — Other Ambulatory Visit: Payer: Self-pay

## 2024-11-20 ENCOUNTER — Other Ambulatory Visit: Payer: Self-pay

## 2024-12-07 ENCOUNTER — Emergency Department (HOSPITAL_COMMUNITY)

## 2024-12-07 ENCOUNTER — Encounter (HOSPITAL_COMMUNITY): Payer: Self-pay

## 2024-12-07 ENCOUNTER — Other Ambulatory Visit: Payer: Self-pay

## 2024-12-07 ENCOUNTER — Emergency Department (HOSPITAL_COMMUNITY)
Admission: EM | Admit: 2024-12-07 | Discharge: 2024-12-07 | Discharge status: Against Medical Advice | Source: Home / Self Care | Attending: Emergency Medicine | Admitting: Emergency Medicine

## 2024-12-07 DIAGNOSIS — H547 Unspecified visual loss: Secondary | ICD-10-CM | POA: Insufficient documentation

## 2024-12-07 DIAGNOSIS — R791 Abnormal coagulation profile: Secondary | ICD-10-CM | POA: Insufficient documentation

## 2024-12-07 DIAGNOSIS — Z5329 Procedure and treatment not carried out because of patient's decision for other reasons: Secondary | ICD-10-CM | POA: Diagnosis not present

## 2024-12-07 DIAGNOSIS — R519 Headache, unspecified: Secondary | ICD-10-CM | POA: Diagnosis present

## 2024-12-07 DIAGNOSIS — H532 Diplopia: Secondary | ICD-10-CM | POA: Diagnosis not present

## 2024-12-07 DIAGNOSIS — R799 Abnormal finding of blood chemistry, unspecified: Secondary | ICD-10-CM | POA: Diagnosis not present

## 2024-12-07 DIAGNOSIS — Z85038 Personal history of other malignant neoplasm of large intestine: Secondary | ICD-10-CM | POA: Insufficient documentation

## 2024-12-07 LAB — CBC
HCT: 46.8 % — ABNORMAL HIGH (ref 36.0–46.0)
Hemoglobin: 15.3 g/dL — ABNORMAL HIGH (ref 12.0–15.0)
MCH: 31.4 pg (ref 26.0–34.0)
MCHC: 32.7 g/dL (ref 30.0–36.0)
MCV: 95.9 fL (ref 80.0–100.0)
Platelets: 335 K/uL (ref 150–400)
RBC: 4.88 MIL/uL (ref 3.87–5.11)
RDW: 12.2 % (ref 11.5–15.5)
WBC: 5.9 K/uL (ref 4.0–10.5)
nRBC: 0 % (ref 0.0–0.2)

## 2024-12-07 LAB — I-STAT CHEM 8, ED
BUN: 11 mg/dL (ref 6–20)
BUN: 12 mg/dL (ref 6–20)
Calcium, Ion: 1.15 mmol/L (ref 1.15–1.40)
Calcium, Ion: 1.21 mmol/L (ref 1.15–1.40)
Chloride: 103 mmol/L (ref 98–111)
Chloride: 105 mmol/L (ref 98–111)
Creatinine, Ser: 0.8 mg/dL (ref 0.44–1.00)
Creatinine, Ser: 0.8 mg/dL (ref 0.44–1.00)
Glucose, Bld: 81 mg/dL (ref 70–99)
Glucose, Bld: 84 mg/dL (ref 70–99)
HCT: 46 % (ref 36.0–46.0)
HCT: 48 % — ABNORMAL HIGH (ref 36.0–46.0)
Hemoglobin: 15.6 g/dL — ABNORMAL HIGH (ref 12.0–15.0)
Hemoglobin: 16.3 g/dL — ABNORMAL HIGH (ref 12.0–15.0)
Potassium: 3.7 mmol/L (ref 3.5–5.1)
Potassium: 3.8 mmol/L (ref 3.5–5.1)
Sodium: 140 mmol/L (ref 135–145)
Sodium: 140 mmol/L (ref 135–145)
TCO2: 23 mmol/L (ref 22–32)
TCO2: 25 mmol/L (ref 22–32)

## 2024-12-07 LAB — PROTIME-INR
INR: 1 (ref 0.8–1.2)
Prothrombin Time: 13.6 s (ref 11.4–15.2)

## 2024-12-07 LAB — COMPREHENSIVE METABOLIC PANEL WITH GFR
ALT: 12 U/L (ref 0–44)
AST: 19 U/L (ref 15–41)
Albumin: 5 g/dL (ref 3.5–5.0)
Alkaline Phosphatase: 54 U/L (ref 38–126)
Anion gap: 10 (ref 5–15)
BUN: 11 mg/dL (ref 6–20)
CO2: 26 mmol/L (ref 22–32)
Calcium: 9.8 mg/dL (ref 8.9–10.3)
Chloride: 103 mmol/L (ref 98–111)
Creatinine, Ser: 0.72 mg/dL (ref 0.44–1.00)
GFR, Estimated: >60 mL/min (ref >60)
Glucose, Bld: 81 mg/dL (ref 70–99)
Potassium: 4 mmol/L (ref 3.5–5.1)
Sodium: 138 mmol/L (ref 135–145)
Total Bilirubin: 0.6 mg/dL (ref 0.0–1.2)
Total Protein: 8 g/dL (ref 6.5–8.1)

## 2024-12-07 LAB — DIFFERENTIAL
Abs Immature Granulocytes: 0.02 K/uL (ref 0.00–0.07)
Basophils Absolute: 0 K/uL (ref 0.0–0.1)
Basophils Relative: 1 %
Eosinophils Absolute: 0.1 K/uL (ref 0.0–0.5)
Eosinophils Relative: 2 %
Immature Granulocytes: 0 %
Lymphocytes Relative: 29 %
Lymphs Abs: 1.7 K/uL (ref 0.7–4.0)
Monocytes Absolute: 0.5 K/uL (ref 0.1–1.0)
Monocytes Relative: 8 %
Neutro Abs: 3.5 K/uL (ref 1.7–7.7)
Neutrophils Relative %: 60 %

## 2024-12-07 LAB — CBG MONITORING, ED: Glucose-Capillary: 71 mg/dL (ref 70–99)

## 2024-12-07 LAB — APTT: aPTT: 25 s (ref 24–36)

## 2024-12-07 LAB — ETHANOL: Alcohol, Ethyl (B): <15 mg/dL (ref <15)

## 2024-12-07 LAB — HCG, SERUM, QUALITATIVE: Preg, Serum: NEGATIVE

## 2024-12-07 MED ORDER — PROCHLORPERAZINE EDISYLATE 10 MG/2ML IJ SOLN
5.0000 mg | Freq: Once | INTRAMUSCULAR | Status: AC
  Administered 2024-12-07: 13:00:00 5 mg via INTRAVENOUS
  Filled 2024-12-07: qty 2

## 2024-12-07 MED ORDER — GADOBUTROL 1 MMOL/ML IV SOLN: 6.0000 mL | Freq: Once | INTRAVENOUS | Status: DC | PRN

## 2024-12-07 MED ORDER — GADOBUTROL 1 MMOL/ML IV SOLN
6.0000 mL | Freq: Once | INTRAVENOUS | Status: AC | PRN
  Administered 2024-12-07: 14:00:00 6 mL via INTRAVENOUS

## 2024-12-07 MED ORDER — SODIUM CHLORIDE 0.9% FLUSH
3.0000 mL | Freq: Once | INTRAVENOUS | Status: AC
  Administered 2024-12-07: 13:00:00 3 mL via INTRAVENOUS

## 2024-12-07 NOTE — ED Triage Notes (Signed)
 Reports at 0945 had blurred vision and then sudden vision loss with severe headache.  Reports she got clammy and nauseated.  Patient reports vision is back but blurry and peripheral vision is still not there and headache is the worse she has had.

## 2024-12-07 NOTE — Discharge Instructions (Signed)
 The MRI has not come back yet.  However you are going home.  There may be risks to this.  Follow-up with your doctor and neurology.

## 2024-12-07 NOTE — ED Provider Notes (Signed)
  EMERGENCY DEPARTMENT AT [facility] Provider Note   CSN: [id number] Arrival date & time: 12/07/24  1055     Patient presents with: Loss of Vision and Headache   Tracey Watson is a 38 y.o. female.    Headache At around 945 had acute onset of headache and blurred vision.  States had vision changes.  White on the periphery with decreased vision and then became white all over.  Did have some swirly lines.  Did have severe headache however.  Gets occasional headaches but states not like this.  Some mild nausea.  Some mild photophobia.  Vision improving some but still not back at baseline.  Still has headache.  Had been screened at the [facility] due to possible stroke.    Past Medical History:  Diagnosis Date   Anxiety    Atypical mole 11/06/2010   Right Ant. 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 Austin)   Ectopic pregnancy 08/11/2012   Headache    migraines   History of kidney stones    h/o   Hot flashes    Irregular heart beat    Melanoma (HCC) 2004   Upper Back (38yrs old)    Prior to Admission medications  Medication Sig Start Date End Date Taking? Authorizing Provider  ALPRAZolam  (XANAX ) 0.25 MG tablet TAKE 1 TABLET BY MOUTH AT BEDTIME AS NEEDED FOR ANXIETY 11/20/24  Yes Clapp, Kara F, PA-C  amphetamine -dextroamphetamine  (ADDERALL) 20 MG tablet TAKE 1 TABLET BY MOUTH 3 TIMES DAILY AS DIRECTED 11/20/24  Yes Clapp, Kara F, PA-C  EPINEPHrine  0.3 mg/0.3 mL IJ SOAJ injection Inject 0.3 mg into the muscle as needed for anaphylaxis. 06/03/23  Yes Boscia, Arelia E, NP  ibuprofen  (ADVIL ) 800 MG tablet Take 1 tablet (800 mg total) by mouth every 6 (six) hours as needed. 08/22/24  Yes Clapp, Kara F, PA-C  levothyroxine   (SYNTHROID ) 100 MCG tablet TAKE 1 TABLET BY MOUTH ONCE DAILY ON AN EMPTY STOMACH. WAIT 30 MINUTES BEFORE TAKING OTHER MEDS. 06/21/24  Yes Gayle, Kara F, PA-C  amitriptyline  (ELAVIL ) 10 MG tablet Take 1 tablet (10 mg total) by mouth at bedtime. Patient not taking: Reported on 12/07/2024 08/23/24   Gayle Saddie FALCON, PA-C    Allergies: Iodinated contrast media    Review of Systems  Neurological:  Positive for headaches.    Updated Vital Signs BP 113/85   Pulse 64   Temp 97.7 F (36.5 C) (Oral)   Resp 17   Ht 5' 4 (1.626 m)   Wt 63.1 kg   SpO2 99%   BMI 23.88 kg/m   Physical Exam Vitals and nursing note reviewed.  Eyes:     Extraocular Movements: Extraocular movements intact.     Pupils: Pupils are equal, round, and reactive to light.     Comments: Potential decreased visual fields laterally particular on the left eye.  Cardiovascular:     Rate and Rhythm: Regular rhythm.  Neurological:     Mental Status: She is alert.     Comments: Awake and appropriate.  Good grip strength bilaterally.  Does prefer to have her eyes closed but will open for conversation.  Does have potentially decreased peripheral vision.     (  all labs ordered are listed, but only abnormal results are displayed) Labs Reviewed  CBC - Abnormal; Notable for the following components:      Result Value   Hemoglobin 15.3 (*)    HCT 46.8 (*)    All other components within normal limits  I-STAT CHEM 8, ED - Abnormal; Notable for the following components:   Hemoglobin 16.3 (*)    HCT 48.0 (*)    All other components within normal limits  I-STAT CHEM 8, ED - Abnormal; Notable for the following components:   Hemoglobin 15.6 (*)    All other components within normal limits  PROTIME-INR  APTT  DIFFERENTIAL  COMPREHENSIVE METABOLIC PANEL WITH GFR  ETHANOL  HCG, SERUM, QUALITATIVE  CBG MONITORING, ED    EKG: None  Radiology: CT HEAD WO CONTRAST Result Date: 12/07/2024 EXAM: CT HEAD WITHOUT CONTRAST  12/07/2024 11:27:27 AM TECHNIQUE: CT of the head was performed without the administration of intravenous contrast. Automated exposure control, iterative reconstruction, and/or weight based adjustment of the mA/kV was utilized to reduce the radiation dose to as low as reasonably achievable. COMPARISON: None available. CLINICAL HISTORY: Diplopia FINDINGS: BRAIN AND VENTRICLES: No acute hemorrhage. No evidence of acute infarct. No hydrocephalus. No extra-axial collection. No mass effect or midline shift. ORBITS: No acute abnormality. SINUSES: Bilateral maxillary sinus mild mucosal thickening. SOFT TISSUES AND SKULL: No acute soft tissue abnormality. No skull fracture. IMPRESSION: 1. No acute intracranial abnormality. Electronically signed by: Morgane Naveau MD 12/07/2024 11:32 AM EST RP Workstation: [id number]     Procedures   Medications Ordered in the ED  sodium chloride  flush (NS) 0.9 % injection 3 mL (3 mLs Intravenous Given 12/07/24 1251)  prochlorperazine  (COMPAZINE ) injection 5 mg (5 mg Intravenous Given 12/07/24 1251)  gadobutrol  (GADAVIST ) 1 MMOL/ML injection 6 mL (6 mLs Intravenous Contrast Given 12/07/24 1356)                                    Medical Decision Making Amount and/or Complexity of Data Reviewed Labs: ordered. Radiology: ordered.  Risk Prescription drug management.   Patient acute headache with vision changes.  Differential diagnose did include subarachnoid hemorrhage.  Acute onset headache with neurodeficits was worrisome for this.  However initial head CT negative.  This does allow us  more time for further workup.  Potentially could have aneurysmal component or even complicated migraine.  Will treat with Compazine  to treat potential complicated migraine.  However will get MRI MRA of the brain.  Will use contrast with history of melanoma and other potential neurologic causes.  MRI is not resulted, however patient feeling somewhat better.  Eager to leave.  not  willing to wait for results of the MRI.  Will follow-up with neurology.  Aware of risks.  Will discharge AMA.      Final diagnoses:  Bad headache  Vision loss    ED Discharge Orders          Ordered    Ambulatory referral to Neurology       Comments: An appointment is requested in approximately: 2 weeks   12/07/24 1541               Patsey Lot, MD 12/07/24 1546

## 2024-12-07 NOTE — ED Notes (Signed)
 Patient taken to bridge for evaluation and Dr Dasie evaluated patient and did not want Code Stroke called.

## 2024-12-07 NOTE — ED Notes (Signed)
 Patient transported to MRI

## 2024-12-22 ENCOUNTER — Other Ambulatory Visit: Payer: Self-pay

## 2024-12-26 ENCOUNTER — Other Ambulatory Visit

## 2024-12-26 ENCOUNTER — Other Ambulatory Visit: Payer: Self-pay

## 2024-12-26 DIAGNOSIS — Z130 Encounter for screening for diseases of the blood and blood-forming organs and certain disorders involving the immune mechanism: Secondary | ICD-10-CM

## 2024-12-26 DIAGNOSIS — Z13220 Encounter for screening for lipoid disorders: Secondary | ICD-10-CM

## 2024-12-26 DIAGNOSIS — Z13228 Encounter for screening for other metabolic disorders: Secondary | ICD-10-CM

## 2024-12-26 DIAGNOSIS — R7989 Other specified abnormal findings of blood chemistry: Secondary | ICD-10-CM

## 2024-12-27 ENCOUNTER — Other Ambulatory Visit

## 2024-12-28 LAB — LIPID PANEL
Chol/HDL Ratio: 2.4 ratio (ref 0.0–4.4)
Cholesterol, Total: 209 mg/dL — ABNORMAL HIGH (ref 100–199)
HDL: 88 mg/dL
LDL Chol Calc (NIH): 108 mg/dL — ABNORMAL HIGH (ref 0–99)
Triglycerides: 75 mg/dL (ref 0–149)
VLDL Cholesterol Cal: 13 mg/dL (ref 5–40)

## 2024-12-28 LAB — HEMOGLOBIN A1C
Est. average glucose Bld gHb Est-mCnc: 91 mg/dL
Hgb A1c MFr Bld: 4.8 % (ref 4.8–5.6)

## 2024-12-28 LAB — VITAMIN D 25 HYDROXY (VIT D DEFICIENCY, FRACTURES): Vit D, 25-Hydroxy: 32.4 ng/mL (ref 30.0–100.0)

## 2024-12-28 LAB — COMPREHENSIVE METABOLIC PANEL WITH GFR
ALT: 13 IU/L (ref 0–32)
AST: 14 IU/L (ref 0–40)
Albumin: 4.8 g/dL (ref 3.9–4.9)
Alkaline Phosphatase: 51 IU/L (ref 41–116)
BUN/Creatinine Ratio: 13 (ref 9–23)
BUN: 11 mg/dL (ref 6–20)
Bilirubin Total: 0.7 mg/dL (ref 0.0–1.2)
CO2: 22 mmol/L (ref 20–29)
Calcium: 9.7 mg/dL (ref 8.7–10.2)
Chloride: 102 mmol/L (ref 96–106)
Creatinine, Ser: 0.83 mg/dL (ref 0.57–1.00)
Globulin, Total: 2.8 g/dL (ref 1.5–4.5)
Glucose: 79 mg/dL (ref 70–99)
Potassium: 4.5 mmol/L (ref 3.5–5.2)
Sodium: 138 mmol/L (ref 134–144)
Total Protein: 7.6 g/dL (ref 6.0–8.5)
eGFR: 92 mL/min/1.73

## 2024-12-28 LAB — CBC WITH DIFFERENTIAL/PLATELET
Basophils Absolute: 0 x10E3/uL (ref 0.0–0.2)
Basos: 1 %
EOS (ABSOLUTE): 0.1 x10E3/uL (ref 0.0–0.4)
Eos: 2 %
Hematocrit: 43.5 % (ref 34.0–46.6)
Hemoglobin: 14.5 g/dL (ref 11.1–15.9)
Immature Grans (Abs): 0 x10E3/uL (ref 0.0–0.1)
Immature Granulocytes: 0 %
Lymphocytes Absolute: 1.6 x10E3/uL (ref 0.7–3.1)
Lymphs: 26 %
MCH: 31.5 pg (ref 26.6–33.0)
MCHC: 33.3 g/dL (ref 31.5–35.7)
MCV: 94 fL (ref 79–97)
Monocytes Absolute: 0.4 x10E3/uL (ref 0.1–0.9)
Monocytes: 7 %
Neutrophils Absolute: 4 x10E3/uL (ref 1.4–7.0)
Neutrophils: 64 %
Platelets: 323 x10E3/uL (ref 150–450)
RBC: 4.61 x10E6/uL (ref 3.77–5.28)
RDW: 11.7 % (ref 11.7–15.4)
WBC: 6.1 x10E3/uL (ref 3.4–10.8)

## 2024-12-28 LAB — THYROID PANEL WITH TSH
Free Thyroxine Index: 3.7 (ref 1.2–4.9)
T3 Uptake Ratio: 34 % (ref 24–39)
T4, Total: 11 ug/dL (ref 4.5–12.0)
TSH: 1.53 u[IU]/mL (ref 0.450–4.500)

## 2024-12-29 ENCOUNTER — Ambulatory Visit: Payer: Self-pay

## 2025-01-02 ENCOUNTER — Encounter

## 2025-01-16 ENCOUNTER — Encounter

## 2025-01-17 ENCOUNTER — Ambulatory Visit

## 2025-01-17 VITALS — BP 125/84 | HR 69 | Temp 98.2°F | Ht 64.0 in | Wt 144.1 lb

## 2025-01-17 DIAGNOSIS — F17200 Nicotine dependence, unspecified, uncomplicated: Secondary | ICD-10-CM | POA: Diagnosis not present

## 2025-01-17 DIAGNOSIS — G43009 Migraine without aura, not intractable, without status migrainosus: Secondary | ICD-10-CM

## 2025-01-17 DIAGNOSIS — E079 Disorder of thyroid, unspecified: Secondary | ICD-10-CM

## 2025-01-17 DIAGNOSIS — F900 Attention-deficit hyperactivity disorder, predominantly inattentive type: Secondary | ICD-10-CM | POA: Diagnosis not present

## 2025-01-17 DIAGNOSIS — F988 Other specified behavioral and emotional disorders with onset usually occurring in childhood and adolescence: Secondary | ICD-10-CM | POA: Diagnosis not present

## 2025-01-17 DIAGNOSIS — R5383 Other fatigue: Secondary | ICD-10-CM | POA: Diagnosis not present

## 2025-01-17 DIAGNOSIS — Z0000 Encounter for general adult medical examination without abnormal findings: Secondary | ICD-10-CM | POA: Diagnosis not present

## 2025-01-17 DIAGNOSIS — N6321 Unspecified lump in the left breast, upper outer quadrant: Secondary | ICD-10-CM

## 2025-01-17 DIAGNOSIS — F411 Generalized anxiety disorder: Secondary | ICD-10-CM

## 2025-01-17 DIAGNOSIS — F5105 Insomnia due to other mental disorder: Secondary | ICD-10-CM | POA: Diagnosis not present

## 2025-01-17 DIAGNOSIS — F409 Phobic anxiety disorder, unspecified: Secondary | ICD-10-CM | POA: Diagnosis not present

## 2025-01-17 MED ORDER — AMPHETAMINE-DEXTROAMPHETAMINE 20 MG PO TABS: 20.0000 mg | ORAL_TABLET | Freq: Three times a day (TID) | ORAL | 0 refills | Status: AC | PRN

## 2025-01-17 MED ORDER — BUPROPION HCL ER (XL) 150 MG PO TB24: 150.0000 mg | ORAL_TABLET | Freq: Every day | ORAL | 5 refills | Status: AC

## 2025-01-17 NOTE — Progress Notes (Unsigned)
 "  Complete physical exam  Patient: Tracey Watson   DOB: Feb 18, 1986   39 y.o. Female  MRN: [id number]  Subjective:    Chief Complaint  Patient presents with   Annual Exam    Physical     Tracey Watson is a 39 y.o. female who presents today for a complete physical exam. She reports consuming a {diet types:17450} diet. {types:19826} She generally feels {DESC; WELL/FAIRLY WELL/POORLY:18703}. She reports sleeping {DESC; WELL/FAIRLY WELL/POORLY:18703}. She {does/does not:200015} have additional problems to discuss today.   Discussed the use of AI scribe software for clinical note transcription with the patient, who gave verbal consent to proceed.  History of Present Illness   Tracey Watson is a 39 year old female who presents with a recent episode of sudden vision loss and headache.  Acute vision loss and headache - Sudden onset of vision loss at work around 10 AM, described as 'messing with me,' with floaters and peripheral vision loss - Unable to see objects directly in front of her during the episode - Concurrent onset of pounding headache - Episode lasted approximately five minutes - Persistent headache for two days following the event - No recurrence of similar episodes since initial event  Hospital evaluation - MRI of the brain was normal - Blood work showed initially elevated hemoglobin, which later normalized  Thyroid  dysfunction - History of thyroid  issues - Nonadherence to thyroid  medication prior to the episode - Resumed thyroid  medication after the episode - Current symptoms include fatigue, constipation, and weight gain - Constipation now requires occasional use of laxatives; previously had regular bowel movements - Weight gain despite no changes in diet or activity level  Sleep disturbance and anxiety - Difficulty sleeping, partially attributed to fianc's untreated sleep apnea - Occasional use of Xanax  and Sonata  for sleep and anxiety  Substance use - Vapes  regularly          Most recent fall risk assessment:    01/17/2025    3:26 PM  Fall Risk   Falls in the past year? 0  Injury with Fall? 0  Risk for fall due to : No Fall Risks  Follow up Falls evaluation completed     Most recent depression screenings:    01/17/2025    3:26 PM 08/22/2024    2:19 PM  PHQ 2/9 Scores  PHQ - 2 Score 0 0  PHQ- 9 Score 2     {VISON DENTAL STD PSA (Optional):27386}  {History (Optional):23778}  Patient Care Team: Gayle Saddie JULIANNA DEVONNA as PCP - General (Physician Assistant) Sheffield, Andrez SAUNDERS, PA-C (Inactive) as Physician Assistant (Dermatology)   Show/hide medication list[1]  ROS        Objective:     BP 125/84   Pulse 69   Temp 98.2 F (36.8 C) (Oral)   Ht 5' 4 (1.626 m)   Wt 144 lb 1.9 oz (65.4 kg)   SpO2 100%   BMI 24.74 kg/m  {Vitals History (Optional):23777}  Physical Exam   No results found for any visits on 01/17/25. {Show previous labs (optional):23779}    Assessment & Plan:    Routine Health Maintenance and Physical Exam  Health Maintenance  Topic Date Due   COVID-19 Vaccine (3 - Pfizer risk series) 02/02/2025*   Flu Shot  03/20/2025*   DTaP/Tdap/Td vaccine (2 - Td or Tdap) 09/07/2026   Pap with HPV screening  06/02/2028   Hepatitis B Vaccine  Completed   HPV Vaccine (No Doses Required) Completed  HIV Screening  Completed   Pneumococcal Vaccine  Aged Out   Meningitis B Vaccine  Aged Out   Breast Cancer Screening  Discontinued   Hepatitis C Screening  Discontinued  *Topic was postponed. The date shown is not the original due date.    Discussed health benefits of physical activity, and encouraged her to engage in regular exercise appropriate for her age and condition.  Mass of upper outer quadrant of left breast -     MM 3D DIAGNOSTIC MAMMOGRAM BILATERAL BREAST; Future -     US  BREAST COMPLETE UNI LEFT INC AXILLA; Future  Attention deficit disorder (ADD) in adult -      Amphetamine -Dextroamphetamine ; Take 1 tablet (20 mg total) by mouth 3 (three) times daily as needed.  Dispense: 90 tablet; Refill: 0 -     Amphetamine -Dextroamphetamine ; Take 1 tablet (20 mg total) by mouth 3 (three) times daily as needed.  Dispense: 90 tablet; Refill: 0 -     Amphetamine -Dextroamphetamine ; Take 1 tablet (20 mg total) by mouth 3 (three) times daily as needed.  Dispense: 90 tablet; Refill: 0  Other orders -     buPROPion  HCl ER (XL); Take 1 tablet (150 mg total) by mouth daily.  Dispense: 30 tablet; Refill: 5   Assessment and Plan    Atypical migraine with aura Recent episode resolved without recurrence. MRI normal. Differential includes atypical migraines. - Monitor for recurrence. - Recommended dilated eye exam to rule out retinal issues.  Hypothyroidism Non-adherence led to abnormal labs. Resumed medication improved thyroid  function. - Continue current thyroid  medication regimen.  Constipation Chronic constipation possibly related to thyroid  function or medication side effects. - Consider magnesium citrate for constipation relief.  Abnormal weight gain and fatigue Weight gain and fatigue despite normal thyroid  function. Discussed Wellbutrin  for mood and energy improvement. - Prescribed Wellbutrin  150 mg daily. - Monitor for side effects such as jitteriness or mood changes. - Monitor heart rate due to concurrent Adderall use.  Hyperlipidemia Slight increase in LDL likely due to dietary habits. High HDL levels protective. - Encouraged weight loss to improve cholesterol levels.  Insomnia Difficulty falling asleep, possibly exacerbated by partner's sleep apnea. - Consider magnesium glycinate supplement for sleep improvement. - Continue Sonata  as needed for sleep.  Attention-deficit hyperactivity disorder Managed with Adderall for focus. - Continue current Adderall regimen.  General health maintenance Pap smear up to date. Mammogram normal. - Continue routine  health maintenance screenings.          Return in about 3 months (around 04/17/2025) for Mood, Weight, ADHD .     Saddie JULIANNA Sacks, PA-C       [1] Outpatient Medications Prior to Visit  Medication Sig   ALPRAZolam  (XANAX ) 0.25 MG tablet TAKE 1 TABLET BY MOUTH AT BEDTIME AS NEEDED FOR ANXIETY   EPINEPHrine  0.3 mg/0.3 mL IJ SOAJ injection Inject 0.3 mg into the muscle as needed for anaphylaxis.   ibuprofen  (ADVIL ) 800 MG tablet Take 1 tablet (800 mg total) by mouth every 6 (six) hours as needed.   levothyroxine  (SYNTHROID ) 100 MCG tablet TAKE 1 TABLET BY MOUTH ONCE DAILY ON AN EMPTY STOMACH. WAIT 30 MINUTES BEFORE TAKING OTHER MEDS.   [DISCONTINUED] amphetamine -dextroamphetamine  (ADDERALL) 20 MG tablet TAKE 1 TABLET BY MOUTH 3 TIMES DAILY AS DIRECTED   [DISCONTINUED] amitriptyline  (ELAVIL ) 10 MG tablet Take 1 tablet (10 mg total) by mouth at bedtime. (Patient not taking: Reported on 01/17/2025)   No facility-administered medications prior to visit.  "

## 2025-01-17 NOTE — Patient Instructions (Signed)
 VISIT SUMMARY: During your visit, we discussed your recent episode of sudden vision loss and headache, as well as your thyroid  dysfunction, sleep disturbance, and other health concerns. We reviewed your hospital evaluation results and adjusted your treatment plan accordingly.  YOUR PLAN: ATYPICAL MIGRAINE WITH AURA: You experienced a sudden episode of vision loss and headache, which may be due to an atypical migraine. -Monitor for any recurrence of vision loss or headaches. -Schedule a dilated eye exam to rule out any retinal issues.  HYPOTHYROIDISM: Your thyroid  function was affected by not taking your medication, but it has improved since you resumed your medication. -Continue taking your thyroid  medication as prescribed.  CONSTIPATION: You have been experiencing chronic constipation, which may be related to your thyroid  function or medication side effects. -Consider using magnesium citrate to relieve constipation.  ABNORMAL WEIGHT GAIN AND FATIGUE: You have gained weight and feel fatigued despite normal thyroid  function. -Take Wellbutrin  150 mg daily to help improve your mood and energy levels. -Monitor for any side effects such as jitteriness or mood changes. -Keep an eye on your heart rate due to your concurrent use of Adderall.  HYPERLIPIDEMIA: Your LDL cholesterol levels are slightly elevated, likely due to dietary habits, but your HDL levels are high, which is protective. -Work on losing weight to help improve your cholesterol levels.  INSOMNIA: You have difficulty falling asleep, which may be worsened by your partner's sleep apnea. -Consider taking a magnesium glycinate supplement to help improve your sleep. -Continue using Sonata  as needed for sleep.  ATTENTION-DEFICIT HYPERACTIVITY DISORDER: You are managing your ADHD with Adderall. -Continue taking your Adderall as prescribed.  GENERAL HEALTH MAINTENANCE: Your routine health screenings are up to date. -Continue with your  routine health maintenance screenings.  If you have any problems before your next visit feel free to message me via MyChart (minor issues or questions) or call the office, otherwise you may reach out to schedule an office visit.  Thank you! Saddie Sacks, PA-C

## 2025-01-18 DIAGNOSIS — G43009 Migraine without aura, not intractable, without status migrainosus: Secondary | ICD-10-CM | POA: Insufficient documentation

## 2025-01-18 NOTE — Assessment & Plan Note (Signed)
 Currently still vaping; not ready to quit yet.

## 2025-01-18 NOTE — Assessment & Plan Note (Signed)
 Did not do well with trazodone  or amitriptyline . Using Sonata  prn which is helpful. Discussed magnesium glycinate supplements. Continue Sonata  prn. Advised to contact when she is due for a refill.

## 2025-01-18 NOTE — Assessment & Plan Note (Signed)
 Recent episode resolved without recurrence. MRI/MRA normal.  - Monitor for recurrence. - Recommended dilated eye exam to rule out retinal issues.

## 2025-01-18 NOTE — Assessment & Plan Note (Signed)
Stable.  Continue alprazolam 0.25 mg as needed for breakthrough anxiety.

## 2025-01-18 NOTE — Assessment & Plan Note (Signed)
 History of fibrocystic breast changes noted on ultrasound of left breast a few years ago. Patient has since had breast implants put in and notes that she feels a mass in upper outer quadrant of left breast. Waxes and wanes in size. Sometimes TTP. No overlying skin or nipple changes.  -Order diagnostic mammo and ultrasound to rule out malignancy

## 2025-01-18 NOTE — Assessment & Plan Note (Signed)
 Thryoid panel normal. Continue levothyroxine  100 mcg daily. Will cont to monitor.

## 2025-01-18 NOTE — Assessment & Plan Note (Signed)
 Stable, well-controlled. Continue Adderall 20 milligrams up to 3 times daily. PDMP reviewed, no aberrancies.

## 2025-01-18 NOTE — Assessment & Plan Note (Signed)
 Weight gain and fatigue despite normal thyroid  function. Discussed Wellbutrin  for mood and energy improvement. Labs including thyroid  panel, screening for anemia, and vitamin D all within normal range.  - Prescribed Wellbutrin  150 mg daily. - Monitor for side effects such as jitteriness or mood changes. - Monitor heart rate due to concurrent Adderall use. - Advised to stop medication immediately if she experiences any side effects she does not like.  - Follow up to reassess in 3 months

## 2025-04-17 ENCOUNTER — Ambulatory Visit

## 2025-07-25 ENCOUNTER — Ambulatory Visit: Admitting: Dermatology
